# Patient Record
Sex: Female | Born: 1945 | Race: Black or African American | Hispanic: No | Smoking: Never smoker
Health system: Southern US, Community
[De-identification: ages and names within clinical notes are randomized; demographics above are authoritative.]

## PROBLEM LIST (undated history)

## (undated) DIAGNOSIS — E785 Hyperlipidemia, unspecified: Secondary | ICD-10-CM

## (undated) DIAGNOSIS — G500 Trigeminal neuralgia: Secondary | ICD-10-CM

## (undated) DIAGNOSIS — I10 Essential (primary) hypertension: Secondary | ICD-10-CM

## (undated) DIAGNOSIS — I2510 Atherosclerotic heart disease of native coronary artery without angina pectoris: Secondary | ICD-10-CM

## (undated) DIAGNOSIS — I4891 Unspecified atrial fibrillation: Secondary | ICD-10-CM

## (undated) HISTORY — DX: Trigeminal neuralgia: G50.0

## (undated) HISTORY — PX: CHOLECYSTECTOMY: SHX55

## (undated) HISTORY — PX: ABDOMINAL HYSTERECTOMY: SHX81

---

## 1976-10-04 HISTORY — PX: OTHER SURGICAL HISTORY: SHX169

## 2001-04-10 ENCOUNTER — Emergency Department (HOSPITAL_COMMUNITY): Admission: EM | Admit: 2001-04-10 | Discharge: 2001-04-10 | Payer: Self-pay | Admitting: Emergency Medicine

## 2001-04-10 ENCOUNTER — Encounter: Payer: Self-pay | Admitting: Emergency Medicine

## 2004-07-30 ENCOUNTER — Ambulatory Visit (HOSPITAL_COMMUNITY): Admission: RE | Admit: 2004-07-30 | Discharge: 2004-07-30 | Payer: Self-pay | Admitting: Family Medicine

## 2004-08-12 ENCOUNTER — Ambulatory Visit (HOSPITAL_COMMUNITY): Admission: RE | Admit: 2004-08-12 | Discharge: 2004-08-12 | Payer: Self-pay | Admitting: Family Medicine

## 2004-10-14 ENCOUNTER — Ambulatory Visit (HOSPITAL_COMMUNITY): Admission: RE | Admit: 2004-10-14 | Discharge: 2004-10-14 | Payer: Self-pay | Admitting: Family Medicine

## 2005-01-08 ENCOUNTER — Ambulatory Visit (HOSPITAL_COMMUNITY): Admission: RE | Admit: 2005-01-08 | Discharge: 2005-01-08 | Payer: Self-pay | Admitting: Family Medicine

## 2005-05-19 ENCOUNTER — Ambulatory Visit (HOSPITAL_COMMUNITY): Admission: RE | Admit: 2005-05-19 | Discharge: 2005-05-19 | Payer: Self-pay | Admitting: Family Medicine

## 2006-11-22 ENCOUNTER — Ambulatory Visit (HOSPITAL_COMMUNITY): Admission: RE | Admit: 2006-11-22 | Discharge: 2006-11-22 | Payer: Self-pay | Admitting: Family Medicine

## 2006-12-07 ENCOUNTER — Encounter: Admission: RE | Admit: 2006-12-07 | Discharge: 2006-12-07 | Payer: Self-pay | Admitting: Family Medicine

## 2010-10-24 ENCOUNTER — Encounter: Payer: Self-pay | Admitting: Family Medicine

## 2010-10-25 ENCOUNTER — Encounter: Payer: Self-pay | Admitting: Family Medicine

## 2010-12-22 ENCOUNTER — Ambulatory Visit (HOSPITAL_COMMUNITY)
Admission: RE | Admit: 2010-12-22 | Discharge: 2010-12-22 | Disposition: A | Discharge status: Home / Self Care | Payer: Self-pay | Source: Ambulatory Visit | Attending: Family Medicine | Admitting: Family Medicine

## 2010-12-22 ENCOUNTER — Other Ambulatory Visit (HOSPITAL_COMMUNITY): Payer: Self-pay | Admitting: Family Medicine

## 2010-12-22 DIAGNOSIS — M545 Low back pain, unspecified: Secondary | ICD-10-CM | POA: Insufficient documentation

## 2010-12-22 DIAGNOSIS — N390 Urinary tract infection, site not specified: Secondary | ICD-10-CM

## 2010-12-22 DIAGNOSIS — R1031 Right lower quadrant pain: Secondary | ICD-10-CM | POA: Insufficient documentation

## 2014-08-13 ENCOUNTER — Other Ambulatory Visit (HOSPITAL_COMMUNITY): Payer: Self-pay | Admitting: Family Medicine

## 2014-08-13 DIAGNOSIS — M859 Disorder of bone density and structure, unspecified: Secondary | ICD-10-CM

## 2014-08-13 DIAGNOSIS — M8580 Other specified disorders of bone density and structure, unspecified site: Secondary | ICD-10-CM

## 2014-08-13 DIAGNOSIS — Z1231 Encounter for screening mammogram for malignant neoplasm of breast: Secondary | ICD-10-CM

## 2014-08-16 ENCOUNTER — Other Ambulatory Visit (HOSPITAL_COMMUNITY): Payer: Self-pay

## 2014-08-16 ENCOUNTER — Ambulatory Visit (HOSPITAL_COMMUNITY): Payer: Self-pay

## 2014-09-02 ENCOUNTER — Ambulatory Visit (HOSPITAL_COMMUNITY)
Admission: RE | Admit: 2014-09-02 | Discharge: 2014-09-02 | Disposition: A | Discharge status: Home / Self Care | Payer: Medicare Other | Source: Ambulatory Visit | Attending: Family Medicine | Admitting: Family Medicine

## 2014-09-02 DIAGNOSIS — Z1231 Encounter for screening mammogram for malignant neoplasm of breast: Secondary | ICD-10-CM | POA: Diagnosis present

## 2014-09-02 DIAGNOSIS — M859 Disorder of bone density and structure, unspecified: Secondary | ICD-10-CM

## 2014-09-02 DIAGNOSIS — M8580 Other specified disorders of bone density and structure, unspecified site: Secondary | ICD-10-CM | POA: Insufficient documentation

## 2014-09-06 ENCOUNTER — Other Ambulatory Visit: Payer: Self-pay | Admitting: Family Medicine

## 2014-09-06 DIAGNOSIS — R928 Other abnormal and inconclusive findings on diagnostic imaging of breast: Secondary | ICD-10-CM

## 2014-09-17 ENCOUNTER — Ambulatory Visit (HOSPITAL_COMMUNITY)
Admission: RE | Admit: 2014-09-17 | Discharge: 2014-09-17 | Disposition: A | Discharge status: Home / Self Care | Payer: Medicare Other | Source: Ambulatory Visit | Attending: Family Medicine | Admitting: Family Medicine

## 2014-09-17 DIAGNOSIS — R928 Other abnormal and inconclusive findings on diagnostic imaging of breast: Secondary | ICD-10-CM

## 2014-09-17 DIAGNOSIS — R922 Inconclusive mammogram: Secondary | ICD-10-CM | POA: Insufficient documentation

## 2014-09-17 DIAGNOSIS — Z1231 Encounter for screening mammogram for malignant neoplasm of breast: Secondary | ICD-10-CM | POA: Diagnosis not present

## 2015-08-26 ENCOUNTER — Other Ambulatory Visit (HOSPITAL_COMMUNITY): Payer: Self-pay | Admitting: Family Medicine

## 2015-08-26 DIAGNOSIS — Z1231 Encounter for screening mammogram for malignant neoplasm of breast: Secondary | ICD-10-CM

## 2015-09-22 ENCOUNTER — Ambulatory Visit (HOSPITAL_COMMUNITY)
Admission: RE | Admit: 2015-09-22 | Discharge: 2015-09-22 | Disposition: A | Discharge status: Home / Self Care | Payer: Medicare Other | Source: Ambulatory Visit | Attending: Family Medicine | Admitting: Family Medicine

## 2015-09-22 DIAGNOSIS — Z1231 Encounter for screening mammogram for malignant neoplasm of breast: Secondary | ICD-10-CM

## 2015-09-24 ENCOUNTER — Other Ambulatory Visit: Payer: Self-pay | Admitting: Family Medicine

## 2015-09-24 DIAGNOSIS — R928 Other abnormal and inconclusive findings on diagnostic imaging of breast: Secondary | ICD-10-CM

## 2015-10-09 ENCOUNTER — Other Ambulatory Visit: Payer: Self-pay | Admitting: Family Medicine

## 2015-10-09 DIAGNOSIS — R928 Other abnormal and inconclusive findings on diagnostic imaging of breast: Secondary | ICD-10-CM

## 2015-10-15 ENCOUNTER — Ambulatory Visit
Admission: RE | Admit: 2015-10-15 | Discharge: 2015-10-15 | Disposition: A | Discharge status: Home / Self Care | Payer: Medicare Other | Source: Ambulatory Visit | Attending: Family Medicine | Admitting: Family Medicine

## 2015-10-15 DIAGNOSIS — R928 Other abnormal and inconclusive findings on diagnostic imaging of breast: Secondary | ICD-10-CM

## 2016-05-14 ENCOUNTER — Inpatient Hospital Stay (HOSPITAL_COMMUNITY): Payer: Medicare Other

## 2016-05-14 ENCOUNTER — Encounter (HOSPITAL_COMMUNITY)
Admission: EM | Disposition: A | Discharge status: Home / Self Care | Payer: Self-pay | Source: Home / Self Care | Attending: Cardiology

## 2016-05-14 ENCOUNTER — Inpatient Hospital Stay (HOSPITAL_COMMUNITY)
Admission: EM | Admit: 2016-05-14 | Discharge: 2016-05-15 | DRG: 247 | Disposition: A | Discharge status: Home / Self Care | Payer: Medicare Other | Attending: Cardiology | Admitting: Cardiology

## 2016-05-14 ENCOUNTER — Encounter (HOSPITAL_COMMUNITY): Payer: Self-pay

## 2016-05-14 ENCOUNTER — Emergency Department (HOSPITAL_COMMUNITY): Payer: Medicare Other

## 2016-05-14 DIAGNOSIS — Z79899 Other long term (current) drug therapy: Secondary | ICD-10-CM

## 2016-05-14 DIAGNOSIS — I4891 Unspecified atrial fibrillation: Secondary | ICD-10-CM | POA: Diagnosis present

## 2016-05-14 DIAGNOSIS — E785 Hyperlipidemia, unspecified: Secondary | ICD-10-CM

## 2016-05-14 DIAGNOSIS — I2510 Atherosclerotic heart disease of native coronary artery without angina pectoris: Secondary | ICD-10-CM | POA: Diagnosis present

## 2016-05-14 DIAGNOSIS — E876 Hypokalemia: Secondary | ICD-10-CM | POA: Diagnosis present

## 2016-05-14 DIAGNOSIS — T461X5A Adverse effect of calcium-channel blockers, initial encounter: Secondary | ICD-10-CM | POA: Diagnosis not present

## 2016-05-14 DIAGNOSIS — R9431 Abnormal electrocardiogram [ECG] [EKG]: Secondary | ICD-10-CM

## 2016-05-14 DIAGNOSIS — R001 Bradycardia, unspecified: Secondary | ICD-10-CM | POA: Diagnosis not present

## 2016-05-14 DIAGNOSIS — R079 Chest pain, unspecified: Secondary | ICD-10-CM | POA: Diagnosis present

## 2016-05-14 DIAGNOSIS — I214 Non-ST elevation (NSTEMI) myocardial infarction: Secondary | ICD-10-CM | POA: Diagnosis present

## 2016-05-14 DIAGNOSIS — T502X5A Adverse effect of carbonic-anhydrase inhibitors, benzothiadiazides and other diuretics, initial encounter: Secondary | ICD-10-CM | POA: Diagnosis not present

## 2016-05-14 DIAGNOSIS — I10 Essential (primary) hypertension: Secondary | ICD-10-CM | POA: Diagnosis present

## 2016-05-14 HISTORY — DX: Unspecified atrial fibrillation: I48.91

## 2016-05-14 HISTORY — PX: CARDIAC CATHETERIZATION: SHX172

## 2016-05-14 HISTORY — DX: Essential (primary) hypertension: I10

## 2016-05-14 HISTORY — DX: Hyperlipidemia, unspecified: E78.5

## 2016-05-14 HISTORY — DX: Atherosclerotic heart disease of native coronary artery without angina pectoris: I25.10

## 2016-05-14 LAB — CBC
HCT: 35.5 % — ABNORMAL LOW (ref 36.0–46.0)
Hemoglobin: 11.3 g/dL — ABNORMAL LOW (ref 12.0–15.0)
MCH: 29.4 pg (ref 26.0–34.0)
MCHC: 31.8 g/dL (ref 30.0–36.0)
MCV: 92.4 fL (ref 78.0–100.0)
Platelets: 301 10*3/uL (ref 150–400)
RBC: 3.84 MIL/uL — ABNORMAL LOW (ref 3.87–5.11)
RDW: 12.4 % (ref 11.5–15.5)
WBC: 6.3 10*3/uL (ref 4.0–10.5)

## 2016-05-14 LAB — BASIC METABOLIC PANEL
Anion gap: 6 (ref 5–15)
Anion gap: 8 (ref 5–15)
BUN: 5 mg/dL — ABNORMAL LOW (ref 6–20)
BUN: 8 mg/dL (ref 6–20)
CO2: 25 mmol/L (ref 22–32)
CO2: 27 mmol/L (ref 22–32)
CREATININE: 0.87 mg/dL (ref 0.44–1.00)
Calcium: 9 mg/dL (ref 8.9–10.3)
Calcium: 9.1 mg/dL (ref 8.9–10.3)
Chloride: 106 mmol/L (ref 101–111)
Chloride: 108 mmol/L (ref 101–111)
Creatinine, Ser: 0.72 mg/dL (ref 0.44–1.00)
GFR calc Af Amer: >60 mL/min (ref >60)
GFR calc non Af Amer: >60 mL/min (ref >60)
Glucose, Bld: 89 mg/dL (ref 65–99)
Glucose, Bld: 97 mg/dL (ref 65–99)
POTASSIUM: 3.1 mmol/L — AB (ref 3.5–5.1)
Potassium: 3.7 mmol/L (ref 3.5–5.1)
SODIUM: 139 mmol/L (ref 135–145)
Sodium: 141 mmol/L (ref 135–145)

## 2016-05-14 LAB — CBC WITH DIFFERENTIAL/PLATELET
BASOS ABS: 0.1 10*3/uL (ref 0.0–0.1)
Basophils Relative: 1 %
EOS ABS: 0.3 10*3/uL (ref 0.0–0.7)
EOS PCT: 4 %
HCT: 37.1 % (ref 36.0–46.0)
Hemoglobin: 12.4 g/dL (ref 12.0–15.0)
Lymphocytes Relative: 38 %
Lymphs Abs: 2.3 10*3/uL (ref 0.7–4.0)
MCH: 30.5 pg (ref 26.0–34.0)
MCHC: 33.4 g/dL (ref 30.0–36.0)
MCV: 91.2 fL (ref 78.0–100.0)
Monocytes Absolute: 0.5 10*3/uL (ref 0.1–1.0)
Monocytes Relative: 9 %
Neutro Abs: 2.9 10*3/uL (ref 1.7–7.7)
Neutrophils Relative %: 48 %
PLATELETS: 341 10*3/uL (ref 150–400)
RBC: 4.07 MIL/uL (ref 3.87–5.11)
RDW: 12.4 % (ref 11.5–15.5)
WBC: 6 10*3/uL (ref 4.0–10.5)

## 2016-05-14 LAB — APTT: aPTT: 33 seconds (ref 24–36)

## 2016-05-14 LAB — PROTIME-INR
INR: 1
INR: 1.07
PROTHROMBIN TIME: 13.2 s (ref 11.4–15.2)
Prothrombin Time: 13.9 seconds (ref 11.4–15.2)

## 2016-05-14 LAB — TROPONIN I
TROPONIN I: 0.04 ng/mL — AB (ref <0.03)
Troponin I: 0.24 ng/mL (ref <0.03)
Troponin I: 0.57 ng/mL (ref <0.03)
Troponin I: 0.58 ng/mL (ref <0.03)

## 2016-05-14 LAB — POCT ACTIVATED CLOTTING TIME
Activated Clotting Time: 263 seconds
Activated Clotting Time: 527 seconds

## 2016-05-14 LAB — ECHOCARDIOGRAM COMPLETE
HEIGHTINCHES: 68 in
Weight: 2640 oz

## 2016-05-14 LAB — HEPARIN LEVEL (UNFRACTIONATED): Heparin Unfractionated: 0.89 IU/mL — ABNORMAL HIGH (ref 0.30–0.70)

## 2016-05-14 SURGERY — LEFT HEART CATH AND CORONARY ANGIOGRAPHY
Anesthesia: LOCAL

## 2016-05-14 MED ORDER — HEPARIN SODIUM (PORCINE) 1000 UNIT/ML IJ SOLN
INTRAMUSCULAR | Status: DC | PRN
  Administered 2016-05-14: 3500 [IU] via INTRAVENOUS
  Administered 2016-05-14: 2000 [IU] via INTRAVENOUS
  Administered 2016-05-14: 6500 [IU] via INTRAVENOUS

## 2016-05-14 MED ORDER — ASPIRIN 81 MG PO CHEW
81.0000 mg | CHEWABLE_TABLET | ORAL | Status: AC
  Administered 2016-05-14: 81 mg via ORAL
  Filled 2016-05-14: qty 1

## 2016-05-14 MED ORDER — ACETAMINOPHEN 325 MG PO TABS
650.0000 mg | ORAL_TABLET | ORAL | Status: DC | PRN
  Administered 2016-05-14: 650 mg via ORAL
  Filled 2016-05-14: qty 2

## 2016-05-14 MED ORDER — TICAGRELOR 90 MG PO TABS
ORAL_TABLET | ORAL | Status: AC
  Filled 2016-05-14: qty 1

## 2016-05-14 MED ORDER — MIDAZOLAM HCL 2 MG/2ML IJ SOLN
INTRAMUSCULAR | Status: AC
  Filled 2016-05-14: qty 2

## 2016-05-14 MED ORDER — ASPIRIN EC 81 MG PO TBEC
81.0000 mg | DELAYED_RELEASE_TABLET | Freq: Every day | ORAL | Status: DC
Start: 2016-05-15 — End: 2016-05-15
  Administered 2016-05-15: 81 mg via ORAL
  Filled 2016-05-14: qty 1

## 2016-05-14 MED ORDER — POTASSIUM CHLORIDE CRYS ER 20 MEQ PO TBCR
20.0000 meq | EXTENDED_RELEASE_TABLET | Freq: Every day | ORAL | Status: DC
  Administered 2016-05-14 – 2016-05-15 (×2): 20 meq via ORAL
  Filled 2016-05-14: qty 1

## 2016-05-14 MED ORDER — NITROGLYCERIN 1 MG/10 ML FOR IR/CATH LAB
INTRA_ARTERIAL | Status: AC
  Filled 2016-05-14: qty 10

## 2016-05-14 MED ORDER — NITROGLYCERIN IN D5W 200-5 MCG/ML-% IV SOLN
10.0000 ug/min | INTRAVENOUS | Status: DC
  Administered 2016-05-14: 10 ug/min via INTRAVENOUS
  Filled 2016-05-14: qty 250

## 2016-05-14 MED ORDER — HEPARIN BOLUS VIA INFUSION
4000.0000 [IU] | Freq: Once | INTRAVENOUS | Status: AC
  Administered 2016-05-14: 4000 [IU] via INTRAVENOUS

## 2016-05-14 MED ORDER — TICAGRELOR 90 MG PO TABS
90.0000 mg | ORAL_TABLET | Freq: Two times a day (BID) | ORAL | Status: DC
  Administered 2016-05-14 – 2016-05-15 (×2): 90 mg via ORAL
  Filled 2016-05-14 (×2): qty 1

## 2016-05-14 MED ORDER — SODIUM CHLORIDE 0.9% FLUSH
3.0000 mL | Freq: Two times a day (BID) | INTRAVENOUS | Status: DC
  Administered 2016-05-14: 3 mL via INTRAVENOUS

## 2016-05-14 MED ORDER — NITROGLYCERIN 0.4 MG SL SUBL: 0.4000 mg | SUBLINGUAL_TABLET | SUBLINGUAL | Status: DC | PRN

## 2016-05-14 MED ORDER — VERAPAMIL HCL 2.5 MG/ML IV SOLN
INTRAVENOUS | Status: AC
  Filled 2016-05-14: qty 2

## 2016-05-14 MED ORDER — DILTIAZEM HCL 60 MG PO TABS: 120.0000 mg | ORAL_TABLET | Freq: Two times a day (BID) | ORAL | Status: DC

## 2016-05-14 MED ORDER — HEPARIN SODIUM (PORCINE) 1000 UNIT/ML IJ SOLN
INTRAMUSCULAR | Status: AC
  Filled 2016-05-14: qty 1

## 2016-05-14 MED ORDER — SODIUM CHLORIDE 0.9% FLUSH
3.0000 mL | INTRAVENOUS | Status: DC | PRN
  Administered 2016-05-14: 3 mL via INTRAVENOUS
  Filled 2016-05-14: qty 3

## 2016-05-14 MED ORDER — SODIUM CHLORIDE 0.9 % WEIGHT BASED INFUSION
1.0000 mL/kg/h | INTRAVENOUS | Status: DC
  Administered 2016-05-14: 1 mL/kg/h via INTRAVENOUS

## 2016-05-14 MED ORDER — ONDANSETRON HCL 4 MG/2ML IJ SOLN
4.0000 mg | Freq: Four times a day (QID) | INTRAMUSCULAR | Status: DC | PRN
  Administered 2016-05-14: 4 mg via INTRAVENOUS
  Filled 2016-05-14: qty 2

## 2016-05-14 MED ORDER — NITROGLYCERIN IN D5W 200-5 MCG/ML-% IV SOLN: 10.0000 ug/min | INTRAVENOUS | Status: DC

## 2016-05-14 MED ORDER — HEPARIN (PORCINE) IN NACL 2-0.9 UNIT/ML-% IJ SOLN
INTRAMUSCULAR | Status: DC | PRN
  Administered 2016-05-14: 10 mL via INTRA_ARTERIAL

## 2016-05-14 MED ORDER — FENTANYL CITRATE (PF) 100 MCG/2ML IJ SOLN
INTRAMUSCULAR | Status: DC | PRN
  Administered 2016-05-14: 25 ug via INTRAVENOUS

## 2016-05-14 MED ORDER — DILTIAZEM HCL 60 MG PO TABS
120.0000 mg | ORAL_TABLET | Freq: Four times a day (QID) | ORAL | Status: DC
  Administered 2016-05-14: 120 mg via ORAL
  Filled 2016-05-14: qty 2

## 2016-05-14 MED ORDER — HEART ATTACK BOUNCING BOOK
Freq: Once | Status: AC
  Administered 2016-05-14: 17:00:00
  Filled 2016-05-14: qty 1

## 2016-05-14 MED ORDER — MIDAZOLAM HCL 2 MG/2ML IJ SOLN
INTRAMUSCULAR | Status: DC | PRN
  Administered 2016-05-14: 2 mg via INTRAVENOUS

## 2016-05-14 MED ORDER — NITROGLYCERIN 0.4 MG SL SUBL
0.4000 mg | SUBLINGUAL_TABLET | SUBLINGUAL | Status: DC | PRN
  Administered 2016-05-14 (×2): 0.4 mg via SUBLINGUAL
  Filled 2016-05-14: qty 1

## 2016-05-14 MED ORDER — HEPARIN (PORCINE) IN NACL 2-0.9 UNIT/ML-% IJ SOLN
INTRAMUSCULAR | Status: AC
  Filled 2016-05-14: qty 1000

## 2016-05-14 MED ORDER — AMLODIPINE BESYLATE 5 MG PO TABS
5.0000 mg | ORAL_TABLET | Freq: Every day | ORAL | Status: DC
  Administered 2016-05-14: 5 mg via ORAL
  Filled 2016-05-14: qty 1

## 2016-05-14 MED ORDER — IOPAMIDOL (ISOVUE-370) INJECTION 76%
INTRAVENOUS | Status: AC
Start: 2016-05-14 — End: 2016-05-14
  Filled 2016-05-14: qty 100

## 2016-05-14 MED ORDER — SODIUM CHLORIDE 0.9 % IV SOLN: 250.0000 mL | INTRAVENOUS | Status: DC | PRN

## 2016-05-14 MED ORDER — HEPARIN (PORCINE) IN NACL 100-0.45 UNIT/ML-% IJ SOLN
850.0000 [IU]/h | INTRAMUSCULAR | Status: DC
  Administered 2016-05-14: 1000 [IU]/h via INTRAVENOUS
  Filled 2016-05-14: qty 250

## 2016-05-14 MED ORDER — NITROGLYCERIN 1 MG/10 ML FOR IR/CATH LAB
INTRA_ARTERIAL | Status: DC | PRN
Start: 2016-05-14 — End: 2016-05-14
  Administered 2016-05-14: 200 ug via INTRACORONARY

## 2016-05-14 MED ORDER — ANGIOPLASTY BOOK
Freq: Once | Status: AC
  Administered 2016-05-14: 17:00:00
  Filled 2016-05-14: qty 1

## 2016-05-14 MED ORDER — DILTIAZEM HCL ER 60 MG PO CP12: 120.0000 mg | ORAL_CAPSULE | Freq: Two times a day (BID) | ORAL | Status: DC

## 2016-05-14 MED ORDER — LIDOCAINE HCL (PF) 1 % IJ SOLN
INTRAMUSCULAR | Status: AC
  Filled 2016-05-14: qty 30

## 2016-05-14 MED ORDER — SODIUM CHLORIDE 0.9 % WEIGHT BASED INFUSION
3.0000 mL/kg/h | INTRAVENOUS | Status: DC
  Administered 2016-05-14: 3 mL/kg/h via INTRAVENOUS

## 2016-05-14 MED ORDER — ASPIRIN 81 MG PO CHEW
324.0000 mg | CHEWABLE_TABLET | Freq: Once | ORAL | Status: AC
  Administered 2016-05-14: 324 mg via ORAL
  Filled 2016-05-14: qty 4

## 2016-05-14 MED ORDER — POTASSIUM CHLORIDE CRYS ER 20 MEQ PO TBCR
40.0000 meq | EXTENDED_RELEASE_TABLET | Freq: Once | ORAL | Status: AC
  Administered 2016-05-14: 40 meq via ORAL
  Filled 2016-05-14: qty 2

## 2016-05-14 MED ORDER — IOPAMIDOL (ISOVUE-370) INJECTION 76%
INTRAVENOUS | Status: DC | PRN
  Administered 2016-05-14: 120 mL via INTRAVENOUS

## 2016-05-14 MED ORDER — HYDROCHLOROTHIAZIDE 25 MG PO TABS
12.5000 mg | ORAL_TABLET | Freq: Every day | ORAL | Status: DC
  Administered 2016-05-14 – 2016-05-15 (×2): 12.5 mg via ORAL
  Filled 2016-05-14 (×2): qty 1

## 2016-05-14 MED ORDER — POTASSIUM CHLORIDE 10 MEQ/100ML IV SOLN
10.0000 meq | Freq: Once | INTRAVENOUS | Status: AC
  Administered 2016-05-14: 10 meq via INTRAVENOUS
  Filled 2016-05-14: qty 100

## 2016-05-14 MED ORDER — SODIUM CHLORIDE 0.9 % IV SOLN: INTRAVENOUS | Status: AC

## 2016-05-14 MED ORDER — METOPROLOL TARTRATE 25 MG PO TABS: 25.0000 mg | ORAL_TABLET | Freq: Two times a day (BID) | ORAL | Status: DC

## 2016-05-14 MED ORDER — IOPAMIDOL (ISOVUE-370) INJECTION 76%
INTRAVENOUS | Status: AC
  Filled 2016-05-14: qty 50

## 2016-05-14 MED ORDER — TICAGRELOR 90 MG PO TABS
ORAL_TABLET | ORAL | Status: DC | PRN
  Administered 2016-05-14: 180 mg via ORAL

## 2016-05-14 MED ORDER — HEPARIN SODIUM (PORCINE) 1000 UNIT/ML IJ SOLN
INTRAMUSCULAR | Status: AC
Start: 2016-05-14 — End: 2016-05-14
  Filled 2016-05-14: qty 1

## 2016-05-14 MED ORDER — LIDOCAINE HCL (PF) 1 % IJ SOLN
INTRAMUSCULAR | Status: DC | PRN
  Administered 2016-05-14: 3 mL via INTRADERMAL

## 2016-05-14 MED ORDER — HEPARIN (PORCINE) IN NACL 2-0.9 UNIT/ML-% IJ SOLN
INTRAMUSCULAR | Status: DC | PRN
  Administered 2016-05-14: 1000 mL via INTRA_ARTERIAL

## 2016-05-14 MED ORDER — FENTANYL CITRATE (PF) 100 MCG/2ML IJ SOLN
INTRAMUSCULAR | Status: AC
  Filled 2016-05-14: qty 2

## 2016-05-14 SURGICAL SUPPLY — 20 items
BALLN EMERGE MR 2.0X12 (BALLOONS) ×2
BALLN ~~LOC~~ EMERGE MR 2.5X12 (BALLOONS) ×2
BALLOON EMERGE MR 2.0X12 (BALLOONS) ×1 IMPLANT
BALLOON ~~LOC~~ EMERGE MR 2.5X12 (BALLOONS) ×1 IMPLANT
CATH INFINITI 5 FR JL3.5 (CATHETERS) ×2 IMPLANT
CATH INFINITI 5FR ANG PIGTAIL (CATHETERS) ×2 IMPLANT
CATH INFINITI JR4 5F (CATHETERS) ×2 IMPLANT
CATH VISTA GUIDE 6FR XBLAD3.0 (CATHETERS) ×2 IMPLANT
DEVICE RAD COMP TR BAND LRG (VASCULAR PRODUCTS) ×2 IMPLANT
GLIDESHEATH SLEND SS 6F .021 (SHEATH) ×2 IMPLANT
KIT ENCORE 26 ADVANTAGE (KITS) ×2 IMPLANT
KIT HEART LEFT (KITS) ×2 IMPLANT
PACK CARDIAC CATHETERIZATION (CUSTOM PROCEDURE TRAY) ×2 IMPLANT
STENT PROMUS PREM MR 2.25X16 (Permanent Stent) ×2 IMPLANT
SYR MEDRAD MARK V 150ML (SYRINGE) ×2 IMPLANT
TRANSDUCER W/STOPCOCK (MISCELLANEOUS) ×2 IMPLANT
TUBING CIL FLEX 10 FLL-RA (TUBING) ×2 IMPLANT
WIRE COUGAR XT STRL 190CM (WIRE) ×2 IMPLANT
WIRE HI TORQ VERSACORE-J 145CM (WIRE) ×2 IMPLANT
WIRE SAFE-T 1.5MM-J .035X260CM (WIRE) ×2 IMPLANT

## 2016-05-14 NOTE — Progress Notes (Signed)
Pt needs prior authorization for new med brilinta. Info is on front of chart in computer.  Cost is $74 for pt and pt states she cannot afford it.  30 day free card given. Pt would poss like to switch meds.  [place] PA notified. No new orders at this time.

## 2016-05-14 NOTE — Progress Notes (Signed)
  Echocardiogram 2D Echocardiogram has been performed.  Tricia Savage 05/14/2016, 3:35 PM

## 2016-05-14 NOTE — Progress Notes (Signed)
Right Radial TR Band d/c'd. Site sl bruised at insertion site. Palpable 2t  radial and ulnar pulses. Clean dry drsg applied. Pt tolerated well.

## 2016-05-14 NOTE — Progress Notes (Addendum)
Called by nursing that patient's BP starting to elevate. Was normotensive earlier. Dr. Camillia Herter cath note indicates plan to start BB but not ordered. I was planning to start but telemetry reviewed. HR presently 55-60, 2.1 sec pause earlier today, HR in the upper 40s around 1:30pm. Favor d/c diltiazem and starting amlodipine instead. Note she only got short-acting dilt per home regimen this AM around 630. If HR is fine the rest of the day we can probably start metoprolol either late tonight or in the AM tomorrow. Have written nurse order to contact us later this evening with an update. Dayna Dunn PA-C

## 2016-05-14 NOTE — Progress Notes (Signed)
Pt asleep. In no apparent distress VSS

## 2016-05-14 NOTE — Progress Notes (Signed)
Patient: Tricia Savage / Admit Date: 05/14/2016 / Date of Encounter: 05/14/2016, 9:16 AM   Subjective: No further CP. We discussed cath at length and all questions answered.   Objective: Telemetry: NSR Physical Exam: Blood pressure 133/79, pulse 61, temperature 98.3 F (36.8 C), temperature source Oral, resp. rate 18, height 5\' 8"  (1.727 m), weight 163 lb 3.2 oz (74 kg), SpO2 98 %. General: Well developed, well nourished AAF, in no acute distress. Head: Normocephalic, atraumatic, sclera non-icteric, no xanthomas, nares are without discharge. Neck: Negative for carotid bruits. JVP not elevated. Lungs: Clear bilaterally to auscultation without wheezes, rales, or rhonchi. Breathing is unlabored. Heart: RRR S1 S2 without murmurs, rubs, or gallops.  Abdomen: Soft, non-tender, non-distended with normoactive bowel sounds. No rebound/guarding. Extremities: No clubbing or cyanosis. No edema. Distal pedal pulses are 2+ and equal bilaterally. Neuro: Alert and oriented X 3. Moves all extremities spontaneously. Psych:  Responds to questions appropriately with a normal affect.   Intake/Output Summary (Last 24 hours) at 05/14/16 0916 Last data filed at 05/14/16 0721  Gross per 24 hour  Intake           119.52 ml  Output              950 ml  Net          -830.48 ml    Inpatient Medications:  . aspirin  81 mg Oral Pre-Cath  . [START ON 05/15/2016] aspirin EC  81 mg Oral Daily  . diltiazem  120 mg Oral Q6H  . hydrochlorothiazide  12.5 mg Oral Daily  . potassium chloride  20 mEq Oral Daily   Infusions:  . sodium chloride     Followed by  . sodium chloride    . heparin 1,000 Units/hr (05/14/16 0200)  . nitroGLYCERIN      Labs:  Recent Labs  05/14/16 0015 05/14/16 0745  NA 139 141  K 3.1* 3.7  CL 106 108  CO2 27 25  GLUCOSE 97 89  BUN 8 5*  CREATININE 0.87 0.72  CALCIUM 9.1 9.0   No results for input(s): AST, ALT, ALKPHOS, BILITOT, PROT, ALBUMIN in the last 72 hours.  Recent  Labs  05/14/16 0015 05/14/16 0745  WBC 6.0 6.3  NEUTROABS 2.9  --   HGB 12.4 11.3*  HCT 37.1 35.5*  MCV 91.2 92.4  PLT 341 301    Recent Labs  05/14/16 0015 05/14/16 0330  TROPONINI 0.04* 0.24*   Invalid input(s): POCBNP No results for input(s): HGBA1C in the last 72 hours.   Radiology/Studies:  Dg Chest Port 1 View  Result Date: 05/14/2016 CLINICAL DATA:  Acute chest pain for 12 hours. EXAM: PORTABLE CHEST 1 VIEW COMPARISON:  None. FINDINGS: The cardiomediastinal silhouette is unremarkable. There is no evidence of focal airspace disease, pulmonary edema, suspicious pulmonary nodule/mass, pleural effusion, or pneumothorax. No acute bony abnormalities are identified. IMPRESSION: No active disease. Electronically Signed   By: Margarette Canada M.D.   On: 05/14/2016 01:30     Assessment and Plan  53F with HTN, HLD (statin intolerant), remote afib 2000 presented with CP and elevated troponin. Workup notable for + troponin, mild anemia, elevated BP.  1. Chest pain/NSTEMI - plan cath today. Continue ASA, heparin. Intolerant of statin. Risks and benefits of cardiac catheterization have been discussed with the patient.  These include bleeding, infection, kidney damage, stroke, heart attack, death.  The patient understands these risks and is willing to proceed. If +CAD will need BB on board.  2. Accelerated HTN on arrival - f/u BPs improved after receiving SL on admission. She is on diltiazem 120mg  (takes TWICE A DAY not QID as med rec states) and HCTZ at home. Will tentatively change diltiazem to SR form but will need to use cath results to help guide med adjustment (i.e. Possible change to BB if CAD or LV dysfunction are present).  3. Hyperlipidemia - said to be statin intolerant. Can f/u lipid clinic after DC to consider PCSK9 if appropriate.  F/u Liver/lipids in AM.  4. Hypokalemia - suspect r/t HCTZ use. Repleted last night in ER, 3.7 today. Start KCl 13meq daily.  Signed, Melina Copa  PA-C Pager: [phone]   I have personally seen and examined this patient with Melina Copa, PA-C I agree with the assessment and plan as outlined above. She is admitted with unstable angina/NSTEMI. No chest pain on heparin and NTG. Plan cardiac cath later today with possible PCI. Pt agrees to proceed.   [name] 05/14/2016 9:55 AM

## 2016-05-14 NOTE — Progress Notes (Signed)
S/W JANE @ [employer] # [phone]   BRILINTA 90 MG BID ( 30 )   COVER- YES  CO-PAY - $ 74.00    60 TAB  TIER- 3 DRUG  PRIOR APPROVAL -YES # [phone] OPT- 5  PHARMACY : ANY RETAIL

## 2016-05-14 NOTE — Care Management Note (Signed)
Case Management Note  Patient Details  Name: Tricia Savage MRN: [id number] Date of Birth: 27-Aug-1946  Subjective/Objective:   Patient is s/p coroanry stent intervention, lives alone , pta indep. Will be on brilinta, co pay is 74.00 and will need prior [id number] opt 5, RN will give this info to MD, patient states 74.00 is too much for her.  NCM informed her that the MD may change her to something else at her follow up apt. Patient's pharmacy [facility] in [place] on [address] hs brilinta in stock.                 Action/Plan:   Expected Discharge Date:                  Expected Discharge Plan:  Home/Self Care  In-House Referral:     Discharge planning Services  CM Consult  Post Acute Care Choice:    Choice offered to:     DME Arranged:    DME Agency:     HH Arranged:    HH Agency:     Status of Service:  Completed, signed off  If discussed at [employer] of Stay Meetings, dates discussed:    Additional Comments:  Zenon Mayo, RN 05/14/2016, 5:45 PM

## 2016-05-14 NOTE — H&P (Signed)
History & Physical    Patient ID: Tricia Savage MRN: [id number], DOB/AGE: [birth year]   Admit date: 05/14/2016   Primary Physician: Robert Bellow, MD Primary Cardiologist: ---  Patient Profile    70 y o woman with new onset CP, found to have NSTEMI  Past Medical History    Past Medical History:  Diagnosis Date  . Atrial fibrillation ([place])   . Hypertension     Past Surgical History:  Procedure Laterality Date  . ABDOMINAL HYSTERECTOMY       Allergies  Allergies  Allergen Reactions  . Atorvastatin     History of Present Illness    Tricia Savage has a PMH of HTN, HLD (statin intolerant). She also has a remote history of Afib in 2000. She is not on blood thinners. She presents from OSH where she went to for new onset CP and arm pain. Pain started yesterday. She was driving, so at rest. She had an episode of emesis paired with intense pain in her left shoulder down her arm. She aslo experienced some mild CP. Her pain persisted when she was in the OSH ED. She was given nitro with improvement of her pain.   She denies pain with activity. She is pretty active on a daily basis but does not actively exercise. No SOB no PND or orthopnea. She reports that her BP runs usually on the higher side. She reports compliance with her home meds. No new recent stressors.  At the OSH ED she was found to have diffuse ST depressions on ECG and a trop I of 0.04. Started on Heparin drip and nitro drip.   On arrival to [facility] she is pain free.     Home Medications    Prior to Admission medications   Medication Sig Start Date End Date Taking? Authorizing Provider  alendronate-cholecalciferol (FOSAMAX PLUS D) 70-2800 MG-UNIT tablet Take 1 tablet by mouth every 7 (seven) days. Take with a full glass of water on an empty stomach.   Yes Historical Provider, MD  diltiazem (CARDIZEM) 120 MG tablet Take 120 mg by mouth 4 (four) times daily.   Yes Historical Provider, MD  hydrochlorothiazide (HYDRODIURIL)  12.5 MG tablet Take 12.5 mg by mouth daily.   Yes Historical Provider, MD    Family History    No family history on file.  Social History    Social History   Social History  . Marital status: Divorced    Spouse name: N/A  . Number of children: N/A  . Years of education: N/A   Occupational History  . Not on file.   Social History Main Topics  . Smoking status: Never Smoker  . Smokeless tobacco: Never Used  . Alcohol use No  . Drug use: Unknown  . Sexual activity: Not on file   Other Topics Concern  . Not on file   Social History Narrative  . No narrative on file     Review of Systems    General:  No chills, fever, night sweats or weight changes.  Cardiovascular:  No chest pain, dyspnea on exertion, edema, orthopnea, palpitations, paroxysmal nocturnal dyspnea. Dermatological: No rash, lesions/masses Respiratory: No cough, dyspnea Urologic: No hematuria, dysuria Abdominal:   No nausea, vomiting, diarrhea, bright red blood per rectum, melena, or hematemesis Neurologic:  No visual changes, wkns, changes in mental status. All other systems reviewed and are otherwise negative except as noted above.  Physical Exam    Blood pressure (!) 172/82, pulse 64, temperature 98.2  F (36.8 C), temperature source Oral, resp. rate 18, height 5\' 8"  (1.727 m), weight 74.8 kg (165 lb), SpO2 99 %.  General: Pleasant, NAD Psych: Normal affect. Neuro: Alert and oriented X 3. Moves all extremities spontaneously. HEENT: Normal  Neck: Supple without bruits or JVD. Lungs:  Resp regular and unlabored, CTA. Heart: RRR no s3, s4, or murmurs. Abdomen: Soft, non-tender, non-distended, BS + x 4.  Extremities: No clubbing, cyanosis or edema. DP/PT/Radials 2+ and equal bilaterally.  Labs    Troponin (Point of Care Test) No results for input(s): TROPIPOC in the last 72 hours.  Recent Labs  05/14/16 0015  TROPONINI 0.04*   Lab Results  Component Value Date   WBC 6.0 05/14/2016   HGB  12.4 05/14/2016   HCT 37.1 05/14/2016   MCV 91.2 05/14/2016   PLT 341 05/14/2016    Recent Labs Lab 05/14/16 0015  NA 139  K 3.1*  CL 106  CO2 27  BUN 8  CREATININE 0.87  CALCIUM 9.1  GLUCOSE 97   No results found for: CHOL, HDL, LDLCALC, TRIG No results found for: [facility]   Radiology Studies    Dg Chest Port 1 View  Result Date: 05/14/2016 CLINICAL DATA:  Acute chest pain for 12 hours. EXAM: PORTABLE CHEST 1 VIEW COMPARISON:  None. FINDINGS: The cardiomediastinal silhouette is unremarkable. There is no evidence of focal airspace disease, pulmonary edema, suspicious pulmonary nodule/mass, pleural effusion, or pneumothorax. No acute bony abnormalities are identified. IMPRESSION: No active disease. Electronically Signed   By: Margarette Canada M.D.   On: 05/14/2016 01:30    ECG & Cardiac Imaging    Repeat ECG at [facility] with NSR, 1st degree Av block without ST changes. INferior T wave flattening noted. No q waves but with R waves in anterior leads.   Assessment & Plan    Tricia Savage has a PMH of HTN, HLD and Afib? Now with NSTEMI. Now asymptomatic but had ischemic changes on ECG with CP and positive trop at OSH ED.   Plan: - Hep drip and Nitro drip - NPO for LHC in am - ASA 325 given at OSH. 81mg  in am. Holding of statin given her intolerance to simva and atorva before.  - She is on a calcium channel inhibitor. Would switch to BB if confirmed to have CAD.  - TTE ordered    Signed, Cristina Gong, MD 05/14/2016, 3:27 AM

## 2016-05-14 NOTE — H&P (View-Only) (Signed)
Patient: Tricia Savage / Admit Date: 05/14/2016 / Date of Encounter: 05/14/2016, 9:16 AM   Subjective: No further CP. We discussed cath at length and all questions answered.   Objective: Telemetry: NSR Physical Exam: Blood pressure 133/79, pulse 61, temperature 98.3 F (36.8 C), temperature source Oral, resp. rate 18, height 5\' 8"  (1.727 m), weight 163 lb 3.2 oz (74 kg), SpO2 98 %. General: Well developed, well nourished AAF, in no acute distress. Head: Normocephalic, atraumatic, sclera non-icteric, no xanthomas, nares are without discharge. Neck: Negative for carotid bruits. JVP not elevated. Lungs: Clear bilaterally to auscultation without wheezes, rales, or rhonchi. Breathing is unlabored. Heart: RRR S1 S2 without murmurs, rubs, or gallops.  Abdomen: Soft, non-tender, non-distended with normoactive bowel sounds. No rebound/guarding. Extremities: No clubbing or cyanosis. No edema. Distal pedal pulses are 2+ and equal bilaterally. Neuro: Alert and oriented X 3. Moves all extremities spontaneously. Psych:  Responds to questions appropriately with a normal affect.   Intake/Output Summary (Last 24 hours) at 05/14/16 0916 Last data filed at 05/14/16 0721  Gross per 24 hour  Intake           119.52 ml  Output              950 ml  Net          -830.48 ml    Inpatient Medications:  . aspirin  81 mg Oral Pre-Cath  . [START ON 05/15/2016] aspirin EC  81 mg Oral Daily  . diltiazem  120 mg Oral Q6H  . hydrochlorothiazide  12.5 mg Oral Daily  . potassium chloride  20 mEq Oral Daily   Infusions:  . sodium chloride     Followed by  . sodium chloride    . heparin 1,000 Units/hr (05/14/16 0200)  . nitroGLYCERIN      Labs:  Recent Labs  05/14/16 0015 05/14/16 0745  NA 139 141  K 3.1* 3.7  CL 106 108  CO2 27 25  GLUCOSE 97 89  BUN 8 5*  CREATININE 0.87 0.72  CALCIUM 9.1 9.0   No results for input(s): AST, ALT, ALKPHOS, BILITOT, PROT, ALBUMIN in the last 72 hours.  Recent  Labs  05/14/16 0015 05/14/16 0745  WBC 6.0 6.3  NEUTROABS 2.9  --   HGB 12.4 11.3*  HCT 37.1 35.5*  MCV 91.2 92.4  PLT 341 301    Recent Labs  05/14/16 0015 05/14/16 0330  TROPONINI 0.04* 0.24*   Invalid input(s): POCBNP No results for input(s): HGBA1C in the last 72 hours.   Radiology/Studies:  Dg Chest Port 1 View  Result Date: 05/14/2016 CLINICAL DATA:  Acute chest pain for 12 hours. EXAM: PORTABLE CHEST 1 VIEW COMPARISON:  None. FINDINGS: The cardiomediastinal silhouette is unremarkable. There is no evidence of focal airspace disease, pulmonary edema, suspicious pulmonary nodule/mass, pleural effusion, or pneumothorax. No acute bony abnormalities are identified. IMPRESSION: No active disease. Electronically Signed   By: Margarette Canada M.D.   On: 05/14/2016 01:30     Assessment and Plan  53F with HTN, HLD (statin intolerant), remote afib 2000 presented with CP and elevated troponin. Workup notable for + troponin, mild anemia, elevated BP.  1. Chest pain/NSTEMI - plan cath today. Continue ASA, heparin. Intolerant of statin. Risks and benefits of cardiac catheterization have been discussed with the patient.  These include bleeding, infection, kidney damage, stroke, heart attack, death.  The patient understands these risks and is willing to proceed. If +CAD will need BB on board.  2. Accelerated HTN on arrival - f/u BPs improved after receiving SL on admission. She is on diltiazem 120mg  (takes TWICE A DAY not QID as med rec states) and HCTZ at home. Will tentatively change diltiazem to SR form but will need to use cath results to help guide med adjustment (i.e. Possible change to BB if CAD or LV dysfunction are present).  3. Hyperlipidemia - said to be statin intolerant. Can f/u lipid clinic after DC to consider PCSK9 if appropriate.  F/u Liver/lipids in AM.  4. Hypokalemia - suspect r/t HCTZ use. Repleted last night in ER, 3.7 today. Start KCl 29meq daily.  Signed, Melina Copa  PA-C Pager: [phone]   I have personally seen and examined this patient with Melina Copa, PA-C I agree with the assessment and plan as outlined above. She is admitted with unstable angina/NSTEMI. No chest pain on heparin and NTG. Plan cardiac cath later today with possible PCI. Pt agrees to proceed.   [name] 05/14/2016 9:55 AM

## 2016-05-14 NOTE — Interval H&P Note (Signed)
History and Physical Interval Note:  05/14/2016 11:09 AM  Tricia Savage  has presented today for cardiac cath with the diagnosis of NSTEMI  The various methods of treatment have been discussed with the patient and family. After consideration of risks, benefits and other options for treatment, the patient has consented to  Procedure(s): Left Heart Cath and Coronary Angiography (N/A) as a surgical intervention .  The patient's history has been reviewed, patient examined, no change in status, stable for surgery.  I have reviewed the patient's chart and labs.  Questions were answered to the patient's satisfaction.    Cath Lab Visit (complete for each Cath Lab visit)  Clinical Evaluation Leading to the Procedure:   ACS: Yes.    Non-ACS:    Anginal Classification: CCS III  Anti-ischemic medical therapy: Minimal Therapy (1 class of medications)  Non-Invasive Test Results: No non-invasive testing performed  Prior CABG: No previous CABG         Tricia Savage

## 2016-05-14 NOTE — ED Provider Notes (Signed)
[facility] DEPT Provider Note   CSN: [id number] Arrival date & time: 05/14/16  0002  First Provider Contact:  First MD Initiated Contact with Patient 05/14/16 0010     By signing my name below, I, Tricia Savage, attest that this documentation has been prepared under the direction and in the presence of Delora Fuel, MD. Electronically Signed: Shanna Savage, ED Scribe. 05/14/16. 12:20 AM.    History   Chief Complaint Chief Complaint  Patient presents with  . Chest Pain  . Back Pain     The history is provided by the patient. No language interpreter was used.   HPI Comments:  Tricia Savage is a 70 y.o. female with a hx of HLD and HTN, who presents to the Emergency Department complaining of intermittent pain in back of both arms and chest, which started earlier today. Pain is characterized as dull. Associated symptoms include nausea followed by emesis of clear water earlier this morning, tenderness of chest to palpation and sharp pain near shoulder blade. Rates pain a 5 out of 10. Denies exacerbation with deep breaths, nausea and diaphoresis. Family history of heart problems.   No past medical history on file.  There are no active problems to display for this patient.   No past surgical history on file.  OB History    No data available       Home Medications    Prior to Admission medications   Not on File    Family History No family history on file.  Social History Social History  Substance Use Topics  . Smoking status: Not on file  . Smokeless tobacco: Not on file  . Alcohol use Not on file     Allergies   Review of patient's allergies indicates not on file.   Review of Systems Review of Systems  Constitutional: Negative for diaphoresis.  Cardiovascular: Positive for chest pain.  Gastrointestinal: Positive for vomiting. Negative for nausea.  Musculoskeletal: Positive for myalgias.       Localized to back of arms and over left shoulder.  All other systems  reviewed and are negative.    Physical Exam Updated Vital Signs Ht 5\' 8"  (1.727 m)   Wt 165 lb (74.8 kg)   BMI 25.09 kg/m   Physical Exam  Constitutional: She is oriented to person, place, and time. She appears well-developed and well-nourished.  HENT:  Head: Normocephalic and atraumatic.  Eyes: EOM are normal. Pupils are equal, round, and reactive to light.  Neck: Normal range of motion. Neck supple. No JVD present.  Cardiovascular: Normal rate, regular rhythm and normal heart sounds.   No murmur heard. Pulmonary/Chest: Effort normal and breath sounds normal. She has no wheezes. She has no rales. She exhibits no tenderness.  Abdominal: Soft. She exhibits no distension and no mass. There is no tenderness.  Musculoskeletal: Normal range of motion. She exhibits tenderness. She exhibits no edema.  Mild tenderness to left parasternal area.  Lymphadenopathy:    She has no cervical adenopathy.  Neurological: She is alert and oriented to person, place, and time. No cranial nerve deficit. She exhibits normal muscle tone. Coordination normal.  Skin: Skin is warm and dry. No rash noted.  Psychiatric: She has a normal mood and affect. Her behavior is normal. Judgment and thought content normal.  Nursing note and vitals reviewed.    ED Treatments / Results  DIAGNOSTIC STUDIES:  Oxygen Saturation is 99% on room air, normal by my interpretation.    COORDINATION OF CARE:  12:18 AM Discussed treatment plan with pt at bedside, which includes chest x-ray, aspirin and nitroglycerin, and pt agreed to plan.  Labs (all labs ordered are listed, but only abnormal results are displayed) Labs Reviewed  BASIC METABOLIC PANEL - Abnormal; Notable for the following:       Result Value   Potassium 3.1 (*)    All other components within normal limits  TROPONIN I - Abnormal; Notable for the following:    Troponin I 0.04 (*)    All other components within normal limits  CBC WITH  DIFFERENTIAL/PLATELET  PROTIME-INR  APTT  HEPARIN LEVEL (UNFRACTIONATED)    EKG  EKG Interpretation  Date/Time:  Friday May 14 2016 00:09:38 EDT Ventricular Rate:  75 PR Interval:    QRS Duration: 86 QT Interval:  463 QTC Calculation: 518 R Axis:   13 Text Interpretation:  Sinus rhythm Nonspecific repol abnormality, diffuse leads Prolonged QT interval No old tracing to compare Confirmed by [facility]  MD, Ivey Cina ([id number]) on 05/14/2016 12:12:10 AM       Radiology Dg Chest Port 1 View  Result Date: 05/14/2016 CLINICAL DATA:  Acute chest pain for 12 hours. EXAM: PORTABLE CHEST 1 VIEW COMPARISON:  None. FINDINGS: The cardiomediastinal silhouette is unremarkable. There is no evidence of focal airspace disease, pulmonary edema, suspicious pulmonary nodule/mass, pleural effusion, or pneumothorax. No acute bony abnormalities are identified. IMPRESSION: No active disease. Electronically Signed   By: Margarette Canada M.D.   On: 05/14/2016 01:30    Procedures Procedures (including critical care time) CRITICAL CARE Performed by: [id number] Total critical care time: 60 minutes Critical care time was exclusive of separately billable procedures and treating other patients. Critical care was necessary to treat or prevent imminent or life-threatening deterioration. Critical care was time spent personally by me on the following activities: development of treatment plan with patient and/or surrogate as well as nursing, discussions with consultants, evaluation of patient's response to treatment, examination of patient, obtaining history from patient or surrogate, ordering and performing treatments and interventions, ordering and review of laboratory studies, ordering and review of radiographic studies, pulse oximetry and re-evaluation of patient's condition.  Medications Ordered in ED Medications  nitroGLYCERIN (NITROSTAT) SL tablet 0.4 mg (0.4 mg Sublingual Given 05/14/16 0034)  nitroGLYCERIN 50 mg in  dextrose 5 % 250 mL (0.2 mg/mL) infusion (10 mcg/min Intravenous New Bag/Given 05/14/16 0127)  potassium chloride 10 mEq in 100 mL IVPB (not administered)  potassium chloride SA (K-DUR,KLOR-CON) CR tablet 40 mEq (not administered)  heparin bolus via infusion 4,000 Units (not administered)  heparin ADULT infusion 100 units/mL (25000 units/269mL sodium chloride 0.45%) (not administered)  aspirin chewable tablet 324 mg (324 mg Oral Given 05/14/16 0024)     Initial Impression / Assessment and Plan / ED Course  I have reviewed the triage vital signs and the nursing notes.  Pertinent labs & imaging results that were available during my care of the patient were reviewed by me and considered in my medical decision making (see chart for details).  Clinical Course    Arm and back discomfort with ECG changes worrisome for cardiac etiology. She does have risk factors of family history, hypertension, hyperlipidemia. She is given aspirin and sublingual nitroglycerin with complete relief of pain. Workup does show minimally elevated troponin. She is started on heparin. Pain started to recur, so she was placed on nitroglycerin drip. Hypokalemia is noted and she is given both oral and intravenous potassium. Case is discussed with Dr. Raiford Simmonds of cardiology service  who agrees to accept the patient transferred [facility].  Final Clinical Impressions(s) / ED Diagnoses   Final diagnoses:  Non-STEMI (non-ST elevated myocardial infarction) ([place])  Hypokalemia    New Prescriptions New Prescriptions   No medications on file  I personally performed the services described in this documentation, which was scribed in my presence. The recorded information has been reviewed and is accurate.       Delora Fuel, MD [id number]

## 2016-05-14 NOTE — Progress Notes (Signed)
ANTICOAGULATION CONSULT NOTE - Follow Up Consult  Pharmacy Consult for Heparin Indication: chest pain/ACS  Allergies  Allergen Reactions  . Atorvastatin   . Codeine Nausea And Vomiting  . Erythromycin   . Morphine And Related Nausea And Vomiting  . Niacin And Related   . Statins     Patient Measurements: Height: 5\' 8"  (172.7 cm) Weight: 165 lb (74.8 kg) IBW/kg (Calculated) : 63.9 Heparin Dosing Weight: 74.8 kg  Vital Signs: Temp: 98.3 F (36.8 C) (08/11 0855) Temp Source: Oral (08/11 0855) BP: 133/79 (08/11 0855) Pulse Rate: 61 (08/11 0855)  Labs:  Recent Labs  05/14/16 0015 05/14/16 0330 05/14/16 0745  HGB 12.4  --  11.3*  HCT 37.1  --  35.5*  PLT 341  --  301  APTT 33  --   --   LABPROT 13.2 13.9  --   INR 1.00 1.07  --   HEPARINUNFRC  --   --  0.89*  CREATININE 0.87  --  0.72  TROPONINI 0.04* 0.24*  --     Estimated Creatinine Clearance: 68.8 mL/min (by C-G formula based on SCr of 0.8 mg/dL).   Medications:  Scheduled:  . [START ON 05/15/2016] aspirin EC  81 mg Oral Daily  . diltiazem  120 mg Oral Q12H  . hydrochlorothiazide  12.5 mg Oral Daily  . potassium chloride  20 mEq Oral Daily   Infusions:  . sodium chloride    . heparin 1,000 Units/hr (05/14/16 0200)  . nitroGLYCERIN      Assessment: 70 yo F with CP initiated on heparin overnight.  Initial heparin level is slightly above goal.  Will reduce infusion rate accordingly.  Noted plans for cardiac cath today.  Goal of Therapy:  Heparin level 0.3-0.7 units/ml Monitor platelets by anticoagulation protocol: Yes   Plan:  Reduce heparin to 850 units/hr Will not order repeat heparin level as patient scheduled for cath at 1500 today. Follow-up after cath.  [employer], Pharm.D., BCPS Clinical Pharmacist Pager [phone] 05/14/2016 10:44 AM

## 2016-05-14 NOTE — ED Triage Notes (Signed)
Pt c/o dull pain in the back of both arms and some in her chest, also pain in her back that started this am, has been intermittent through the day.

## 2016-05-14 NOTE — Progress Notes (Signed)
ANTICOAGULATION CONSULT NOTE - Initial Consult  Pharmacy Consult for heparin Indication: chest pain/ACS  Allergies  Allergen Reactions  . Atorvastatin     Patient Measurements: Height: 5\' 8"  (172.7 cm) Weight: 165 lb (74.8 kg) IBW/kg (Calculated) : 63.9 Heparin Dosing Weight: 74.8  Vital Signs: Temp: 97.4 F (36.3 C) (08/11 0014) Temp Source: Oral (08/11 0014) BP: 148/98 (08/11 0045) Pulse Rate: 75 (08/11 0045)  Labs:  Recent Labs  05/14/16 0015  HGB 12.4  HCT 37.1  PLT 341  CREATININE 0.87  TROPONINI 0.04*    Estimated Creatinine Clearance: 63.3 mL/min (by C-G formula based on SCr of 0.87 mg/dL).   Medical History: Past Medical History:  Diagnosis Date  . Atrial fibrillation ([place])   . Hypertension     Medications:  Scheduled:  . potassium chloride SA  40 mEq Oral Once   Infusions:  . nitroGLYCERIN    . potassium chloride      Assessment: 70 yo F in ED with intermittent Chest pain radiating to both arms and back.  Troponin elevated.  Noted plans to transfer patient to [facility].  Goal of Therapy:  Heparin level 0.3-0.7 units/ml Monitor platelets by anticoagulation protocol: Yes   Plan:  - Heparin 4000 units IV x 1 - Heparin infusion at 1000 units/hr - Check 6h heparin level - Daily heparin level and CBC - Check PTT, PT/INR now  Vonda Antigua 05/14/2016,1:21 AM

## 2016-05-14 NOTE — ED Notes (Signed)
CRITICAL VALUE ALERT  Critical value received: troponin- 0.04  Date of notification:  05/14/16  Time of notification:  0104  Critical value read back:Yes.    Nurse who received alert:  Idelia Salm, RN  MD notified (1st page):  0104  Time of first page:  0104  MD notified (2nd page):  Time of second page:  Responding MD:  Dr Roxanne Mins  Time MD responded:  [phone]

## 2016-05-14 NOTE — Progress Notes (Signed)
CRITICAL VALUE ALERT  Critical value received:  Tropin 0.24  Date of notification:  05/14/16  Time of notification:  [id number]  Critical value read back:Yes.    Nurse who received alert:  Saddie Benders   MD notified (1st page):  Fudim  Time of first page:  [phone]  MD notified (2nd page):  Time of second page:  Responding MD:    Time MD responded:    No changes with the client otherwise. Nitro gtt and heparin gtt infusing, still pain free. No new orders placed at this time.   Saddie Benders RN

## 2016-05-14 NOTE — Progress Notes (Signed)
Called in to pt's room by family member for pt c/o N/V. VS obtained. Pt diaphoretic. Vomited 50cc's clear colored emesis. Monitor shows NSR.  Cold compress applied to head. 19:39 Zofran 4 mg administered IV as ordered.

## 2016-05-15 ENCOUNTER — Other Ambulatory Visit: Payer: Self-pay | Admitting: Student

## 2016-05-15 ENCOUNTER — Encounter (HOSPITAL_COMMUNITY): Payer: Self-pay | Admitting: Student

## 2016-05-15 DIAGNOSIS — I10 Essential (primary) hypertension: Secondary | ICD-10-CM

## 2016-05-15 DIAGNOSIS — E785 Hyperlipidemia, unspecified: Secondary | ICD-10-CM

## 2016-05-15 LAB — BASIC METABOLIC PANEL
ANION GAP: 11 (ref 5–15)
BUN: 10 mg/dL (ref 6–20)
CHLORIDE: 103 mmol/L (ref 101–111)
CO2: 25 mmol/L (ref 22–32)
Calcium: 9.7 mg/dL (ref 8.9–10.3)
Creatinine, Ser: 0.91 mg/dL (ref 0.44–1.00)
GFR calc non Af Amer: >60 mL/min (ref >60)
Glucose, Bld: 94 mg/dL (ref 65–99)
POTASSIUM: 3.7 mmol/L (ref 3.5–5.1)
SODIUM: 139 mmol/L (ref 135–145)

## 2016-05-15 LAB — HEPATIC FUNCTION PANEL
ALBUMIN: 3.3 g/dL — AB (ref 3.5–5.0)
ALK PHOS: 78 U/L (ref 38–126)
ALT: 14 U/L (ref 14–54)
AST: 22 U/L (ref 15–41)
Bilirubin, Direct: <0.1 mg/dL — ABNORMAL LOW (ref 0.1–0.5)
TOTAL PROTEIN: 6.7 g/dL (ref 6.5–8.1)
Total Bilirubin: 0.5 mg/dL (ref 0.3–1.2)

## 2016-05-15 LAB — CBC
HCT: 37.7 % (ref 36.0–46.0)
HEMOGLOBIN: 12 g/dL (ref 12.0–15.0)
MCH: 29.4 pg (ref 26.0–34.0)
MCHC: 31.8 g/dL (ref 30.0–36.0)
MCV: 92.4 fL (ref 78.0–100.0)
PLATELETS: 306 10*3/uL (ref 150–400)
RBC: 4.08 MIL/uL (ref 3.87–5.11)
RDW: 12.5 % (ref 11.5–15.5)
WBC: 6.8 10*3/uL (ref 4.0–10.5)

## 2016-05-15 LAB — LIPID PANEL
CHOL/HDL RATIO: 7.7 ratio
Cholesterol: 292 mg/dL — ABNORMAL HIGH (ref 0–200)
HDL: 38 mg/dL — ABNORMAL LOW (ref >40)
LDL Cholesterol: 211 mg/dL — ABNORMAL HIGH (ref 0–99)
Triglycerides: 213 mg/dL — ABNORMAL HIGH (ref <150)
VLDL: 43 mg/dL — AB (ref 0–40)

## 2016-05-15 MED ORDER — EZETIMIBE 10 MG PO TABS
10.0000 mg | ORAL_TABLET | Freq: Every day | ORAL | Status: DC
  Administered 2016-05-15: 10 mg via ORAL
  Filled 2016-05-15: qty 1

## 2016-05-15 MED ORDER — METOPROLOL TARTRATE 12.5 MG HALF TABLET
12.5000 mg | ORAL_TABLET | Freq: Two times a day (BID) | ORAL | Status: DC
Start: 2016-05-15 — End: 2016-05-15
  Administered 2016-05-15: 10:00:00 12.5 mg via ORAL
  Filled 2016-05-15: qty 1

## 2016-05-15 MED ORDER — TICAGRELOR 90 MG PO TABS: 90.0000 mg | ORAL_TABLET | Freq: Two times a day (BID) | ORAL | 0 refills | Status: DC

## 2016-05-15 MED ORDER — LISINOPRIL 5 MG PO TABS: 5.0000 mg | ORAL_TABLET | Freq: Every day | ORAL | 6 refills | Status: DC

## 2016-05-15 MED ORDER — NITROGLYCERIN 0.4 MG SL SUBL: 0.4000 mg | SUBLINGUAL_TABLET | SUBLINGUAL | 3 refills | Status: DC | PRN

## 2016-05-15 MED ORDER — LISINOPRIL 5 MG PO TABS
5.0000 mg | ORAL_TABLET | Freq: Every day | ORAL | Status: DC
Start: 2016-05-15 — End: 2016-05-15
  Administered 2016-05-15: 10:00:00 5 mg via ORAL
  Filled 2016-05-15: qty 1

## 2016-05-15 MED ORDER — ASPIRIN 81 MG PO TBEC: 81.0000 mg | DELAYED_RELEASE_TABLET | Freq: Every day | ORAL | Status: AC

## 2016-05-15 MED ORDER — TICAGRELOR 90 MG PO TABS: 90.0000 mg | ORAL_TABLET | Freq: Two times a day (BID) | ORAL | 10 refills | Status: DC

## 2016-05-15 MED ORDER — METOPROLOL TARTRATE 25 MG PO TABS: 12.5000 mg | ORAL_TABLET | Freq: Two times a day (BID) | ORAL | 6 refills | Status: DC

## 2016-05-15 MED ORDER — EZETIMIBE 10 MG PO TABS: 10.0000 mg | ORAL_TABLET | Freq: Every day | ORAL | 6 refills | Status: DC

## 2016-05-15 NOTE — Progress Notes (Addendum)
CARDIAC REHAB PHASE I   PRE:  Rate/Rhythm: 73 SR  BP:  Supine:   Sitting: 174/88  Standing:    SaO2:   MODE:  Ambulation: 1000 ft   POST:  Rate/Rhythm: 90 SR  BP:  Supine:   Sitting: 188/90 recheck BP 147/101  Standing:    SaO2:  0810-0920 Completed MI and stent education with pt. She voices understanding. Will send referral to Outpt. CRP in [place]. BP reported to RN.  Rodney Langton RN 05/15/2016 9:40 AM

## 2016-05-15 NOTE — Progress Notes (Signed)
Pt given all discharge orders and verbalized understanding.  Haed prescriptions given.  Right radial site is level 1 from mild bruising.  No complaints of chest pain at dischg.  Discharged via wheelchair with daughter to home. All belongings with pt.

## 2016-05-15 NOTE — Progress Notes (Signed)
Subjective:    No chest pain or SOB overnight. Mild nausea with dinner now resolved.   Objective:   Temp:  [96.9 F (36.1 C)-98.3 F (36.8 C)] 96.9 F (36.1 C) (08/12 0346) Pulse Rate:  [0-241] 63 (08/12 0600) Resp:  [0-55] 19 (08/12 0600) BP: (122-185)/(61-100) 148/88 (08/12 0600) SpO2:  [0 %-100 %] 96 % (08/12 0600) Weight:  [165 lb (74.8 kg)-170 lb 10.2 oz (77.4 kg)] 170 lb 10.2 oz (77.4 kg) (08/12 0346) Last BM Date: 05/13/16  Filed Weights   05/14/16 0328 05/14/16 1005 05/15/16 0346  Weight: 163 lb 3.2 oz (74 kg) 165 lb (74.8 kg) 170 lb 10.2 oz (77.4 kg)    Intake/Output Summary (Last 24 hours) at 05/15/16 0714 Last data filed at 05/15/16 0347  Gross per 24 hour  Intake              621 ml  Output             2550 ml  Net            -1929 ml    Telemetry: SR  Exam:  General: NAD  HEENT: sclera clear, throat clear  Resp: CTAB  Cardiac: RRR, no m/r/g, no jvd  GI: abdomen soft, NT, ND  MSK: no LE edema  Neuro: no focal deficits  Psych: appropriate affect Lab Results:  Basic Metabolic Panel:  Recent Labs Lab 05/14/16 0015 05/14/16 0745 05/15/16 0411  NA 139 141 139  K 3.1* 3.7 3.7  CL 106 108 103  CO2 27 25 25   GLUCOSE 97 89 94  BUN 8 5* 10  CREATININE 0.87 0.72 0.91  CALCIUM 9.1 9.0 9.7    Liver Function Tests:  Recent Labs Lab 05/15/16 0411  AST 22  ALT 14  ALKPHOS 78  BILITOT 0.5  PROT 6.7  ALBUMIN 3.3*    CBC:  Recent Labs Lab 05/14/16 0015 05/14/16 0745 05/15/16 0411  WBC 6.0 6.3 6.8  HGB 12.4 11.3* 12.0  HCT 37.1 35.5* 37.7  MCV 91.2 92.4 92.4  PLT 341 301 306    Cardiac Enzymes:  Recent Labs Lab 05/14/16 0330 05/14/16 1315 05/14/16 2038  TROPONINI 0.24* 0.57* 0.58*    BNP: No results for input(s): PROBNP in the last 8760 hours.  Coagulation:  Recent Labs Lab 05/14/16 0015 05/14/16 0330  INR 1.00 1.07    ECG:   Medications:   Scheduled Medications: . amLODipine  5 mg Oral Daily  .  aspirin EC  81 mg Oral Daily  . hydrochlorothiazide  12.5 mg Oral Daily  . potassium chloride  20 mEq Oral Daily  . sodium chloride flush  3 mL Intravenous Q12H  . ticagrelor  90 mg Oral BID     Infusions:     PRN Medications:  sodium chloride, acetaminophen, nitroGLYCERIN, ondansetron (ZOFRAN) IV, sodium chloride flush     Assessment/Plan   1. Chest pain/NSTEMI - cath yesterday, severe ramus stenosis s/p DES. - echo this admit LVEF 55-60%, basalinferior hypokinesis.    - continue DAPT one year - medical therapy witth ASA, brillinta. Intolerant to statins. Start low dose lisinopril and lopressor   2. Hyperlipidemia - intolerant to statins - LDL of 211 this admit - we will start trial of zetia 10mg  daily. Can consider pcsk9inhibitor in the future   3. HTN - will d/c norvasc, start ACE-I in setting of CAD. Stop dilt, start lopressor with recent NSTEMI  4. Bradycardia - HRs 40s yesterday, 2.1 sec pauses.  She was on dilt at the time. Discontinued, we will try low dose beta blocker to see how tolerated.    She will need outpatient f/u in 2 weeks.    Carlyle Dolly, M.D.

## 2016-05-15 NOTE — Discharge Summary (Signed)
Discharge Summary    Patient ID: Tricia Savage,  MRN: [id number], DOB/AGE: [birth year] 70 y.o.  Admit date: 05/14/2016 Discharge date: 05/15/2016  Primary Care Provider: Robert Bellow Primary Cardiologist: New - F/U in [employer]  Discharge Diagnoses    Principal Problem:   Non-STEMI (non-ST elevated myocardial infarction) [facility]) Active Problems:   Dyslipidemia, goal LDL below 70   Essential hypertension   History of Present Illness     Tricia Savage is a 70 y.o. female with past medical history of HTN, HLD (statin intolerant)m and remote history of atrial fibrillation in 2000 who presented to [facility] from an OSH on 05/14/2016 for new onset chest pain.  She reported the pain began while she was driving and was associated with nausea, vomiting, and pain down her left arm. She reported being active at baseline and denied any previous episodes of chest discomfort.   At the OSH, her initial EKG showed diffuse ST depression and her initial troponin was mildly elevated at 0.04. Therefore, she was started on Heparin and a NTG drip and transferred to [facility] for further evaluation.   Hospital Course     Consultants: None  Later that morning, she denied any repeat episodes of chest discomfort. Cyclic troponin values continued to trend upwards to 0.58. A cardiac catheterization was recommended for definitive evaluation. The risks and benefits of the procedure were discussed and she agreed to proceed.   Her catheterization showed 99% stenosis of the RI and PCI was performed with placement of a Promus DES. Successful intervention with a 0% residual stenosis. Mild non-obstructive disease in the RCA was noted along with moderate 70% stenosis along a small caliber Diagonal branch. She tolerated the procedure well and no complications were noted. She will be on DAPT with ASA and Brilinta for one year. She is statin intolerant, therefore a PCSK9-inhibitor should be considered following  discharge.  The following morning, she denied any repeat episodes of chest discomfort or dyspnea. Did have mild nausea after consuming dinner but it resolved spontaneously and did not represent. A Lipid Panel was checked and showed an LDL of 211 this admission. She has been started on a trial of Zetia 10mg  daily.   She was on Cardizem CD prior to admission but this had been discontinued due to bradycardia and sinus pauses up to 2 seconds. She had been started on Amlodipine prior to cath but with known CAD, this was switched to Lopressor 12.5mg  BID.  BP remained elevated, therefore she was also placed on Lisinopril 5mg  daily. With the patient already being on HCTZ PTA, will discontinue the inpatient K+ supplement at this time. She will need a repeat BMET at time of follow-up to assess creatinine and K+.   She was last examined by Dr. Harl Bowie and deemed stable for discharge. Case Management was consulted in regards to the patient's Brilinta and the patient says she will not be able to afford the co-pay. Attempted to obtain prior authorization prior to discharge but the office is closed over the weekend. Will provide written Rx to use with 30-day card for Brilinta. Can attempt to get enrolled in patient assistance program as an outpatient, otherwise will need to switch to Plavix. A staff message has been sent to the [place] location to arrange follow-up.  _____________  Discharge Vitals Blood pressure (!) 144/83, pulse 69, temperature 97.4 F (36.3 C), temperature source Oral, resp. rate (!) 21, height 5\' 8"  (1.727 m), weight 170 lb 10.2  oz (77.4 kg), SpO2 98 %.  Filed Weights   05/14/16 0328 05/14/16 1005 05/15/16 0346  Weight: 163 lb 3.2 oz (74 kg) 165 lb (74.8 kg) 170 lb 10.2 oz (77.4 kg)    Labs & Radiologic Studies     CBC  Recent Labs  05/14/16 0015 05/14/16 0745 05/15/16 0411  WBC 6.0 6.3 6.8  NEUTROABS 2.9  --   --   HGB 12.4 11.3* 12.0  HCT 37.1 35.5* 37.7  MCV 91.2 92.4 92.4    PLT 341 301 [id number]   Basic Metabolic Panel  Recent Labs  05/14/16 0745 05/15/16 0411  NA 141 139  K 3.7 3.7  CL 108 103  CO2 25 25  GLUCOSE 89 94  BUN 5* 10  CREATININE 0.72 0.91  CALCIUM 9.0 9.7   Liver Function Tests  Recent Labs  05/15/16 0411  AST 22  ALT 14  ALKPHOS 78  BILITOT 0.5  PROT 6.7  ALBUMIN 3.3*   No results for input(s): LIPASE, AMYLASE in the last 72 hours. Cardiac Enzymes  Recent Labs  05/14/16 0330 05/14/16 1315 05/14/16 2038  TROPONINI 0.24* 0.57* 0.58*   BNP Invalid input(s): POCBNP D-Dimer No results for input(s): DDIMER in the last 72 hours. Hemoglobin A1C No results for input(s): HGBA1C in the last 72 hours. Fasting Lipid Panel  Recent Labs  05/15/16 0411  CHOL 292*  HDL 38*  LDLCALC 211*  TRIG 213*  CHOLHDL 7.7   Thyroid Function Tests No results for input(s): TSH, T4TOTAL, T3FREE, THYROIDAB in the last 72 hours.  Invalid input(s): FREET3  Dg Chest Port 1 View  Result Date: 05/14/2016 CLINICAL DATA:  Acute chest pain for 12 hours. EXAM: PORTABLE CHEST 1 VIEW COMPARISON:  None. FINDINGS: The cardiomediastinal silhouette is unremarkable. There is no evidence of focal airspace disease, pulmonary edema, suspicious pulmonary nodule/mass, pleural effusion, or pneumothorax. No acute bony abnormalities are identified. IMPRESSION: No active disease. Electronically Signed   By: Margarette Canada M.D.   On: 05/14/2016 01:30     Diagnostic Studies/Procedures     Cardiac Catheterization: 05/14/2016  Prox RCA to Mid RCA lesion, 40 %stenosed.  Ost Ramus lesion, 30 %stenosed.  A STENT PROMUS PREM MR 2.25X16 drug eluting stent was successfully placed.  Ramus lesion, 99 %stenosed.  Post intervention, there is a 0% residual stenosis.  1st Diag lesion, 70 %stenosed.  The left ventricular systolic function is normal.  LV end diastolic pressure is normal.  The left ventricular ejection fraction is 55-65% by visual estimate.  There  is no mitral valve regurgitation.   1. Severe stenosis mid Ramus intermediate branch, culprit for NSTEMI 2. Mild non-obstructive disease in the RCA.  3. Moderate stenosis small caliber Diagonal branch 4. Normal LV systolic function 5. Successful PTCA/DES x 1 mid Ramus intermediate branch  Recommendations: Continue DAPT with ASA and Brilinta for one year. She is statin intolerant. Consider PCSK9-inh after discharge. Start beta blocker. Follow up in [facility] office.   Echocardiogram: 05/14/2016 Study Conclusions  - Left ventricle: The cavity size was normal. Wall thickness was   increased in a pattern of mild LVH. Systolic function was normal.   The estimated ejection fraction was in the range of 55% to 60%.   There is akinesis of the basalinferior myocardium. The study is   not technically sufficient to allow evaluation of LV diastolic   function. - Mitral valve: Calcified annulus.  Impressions:  - Akinesis of the basal inferior wall with overall normal  LV   function; mild LVH; trace MR.    Disposition   Pt is being discharged home today in good condition.  Follow-up Plans & Appointments    Follow-up Information    [facility] .   Specialty:  Cardiology Why:  The office should call you within 3 business days with an appointment date and time. If you do not hear from them, please call the number provided. Contact information: [place] [phone]         Discharge Instructions    Diet - low sodium heart healthy    Complete by:  As directed   Discharge instructions    Complete by:  As directed   PLEASE REMEMBER TO BRING ALL OF YOUR MEDICATIONS TO EACH OF YOUR FOLLOW-UP OFFICE VISITS.  PLEASE ATTEND ALL SCHEDULED FOLLOW-UP APPOINTMENTS.   Activity: Increase activity slowly as tolerated. You may shower, but no soaking baths (or swimming) for 1 week. No driving for 24 hours. No lifting over 5 lbs  for 1 week. No sexual activity for 1 week.   You May Return to Work: in 1 week (if applicable)  Wound Care: You may wash cath site gently with soap and water. Keep cath site clean and dry. If you notice pain, swelling, bleeding or pus at your cath site, please call [phone].   Increase activity slowly    Complete by:  As directed      Discharge Medications     Medication List    STOP taking these medications   diltiazem 120 MG tablet Commonly known as:  CARDIZEM     TAKE these medications   alendronate 70 MG tablet Commonly known as:  FOSAMAX Take 70 mg by mouth once a week. Take with a full glass of water on an empty stomach.   aspirin 81 MG EC tablet Take 1 tablet (81 mg total) by mouth daily.   ezetimibe 10 MG tablet Commonly known as:  ZETIA Take 1 tablet (10 mg total) by mouth daily.   hydrochlorothiazide 12.5 MG tablet Commonly known as:  HYDRODIURIL Take 12.5 mg by mouth daily.   lisinopril 5 MG tablet Commonly known as:  PRINIVIL,ZESTRIL Take 1 tablet (5 mg total) by mouth daily.   metoprolol tartrate 25 MG tablet Commonly known as:  LOPRESSOR Take 0.5 tablets (12.5 mg total) by mouth 2 (two) times daily.   nitroGLYCERIN 0.4 MG SL tablet Commonly known as:  NITROSTAT Place 1 tablet (0.4 mg total) under the tongue every 5 (five) minutes x 3 doses as needed for chest pain.   ticagrelor 90 MG Tabs tablet Commonly known as:  BRILINTA Take 1 tablet (90 mg total) by mouth 2 (two) times daily.       Aspirin prescribed at discharge?  Yes High Intensity Statin Prescribed? (Lipitor 40-80mg  or Crestor 20-40mg ): No: Statin Intolerant, started on Zetia.  Beta Blocker Prescribed? Yes For EF 40% or less, Was ACEI/ARB Prescribed? Yes ADP Receptor Inhibitor Prescribed? (i.e. Plavix etc.-Includes Medically Managed Patients): Yes For EF <40%, Aldosterone Inhibitor Prescribed? No: N/A Was EF assessed during THIS hospitalization? Yes - Cardiac Catheterization and  Echo Was Cardiac Rehab II ordered? (Included Medically managed Patients): Yes   Allergies Allergies  Allergen Reactions  . Atorvastatin   . Codeine Nausea And Vomiting  . Erythromycin   . Morphine And Related Nausea And Vomiting  . Niacin And Related   . Statins      Outstanding Labs/Studies   BMET  at hospital follow-up.   Duration of Discharge Encounter   Greater than 30 minutes including physician time.  Signed, Erma Heritage, PA-C 05/15/2016, 8:22 AM

## 2016-05-17 MED FILL — Nitroglycerin IV Soln 100 MCG/ML in D5W: INTRA_ARTERIAL | Qty: 10 | Status: AC

## 2016-05-31 ENCOUNTER — Encounter: Payer: Self-pay | Admitting: Cardiology

## 2016-05-31 ENCOUNTER — Ambulatory Visit (INDEPENDENT_AMBULATORY_CARE_PROVIDER_SITE_OTHER): Payer: Medicare Other | Admitting: Cardiology

## 2016-05-31 DIAGNOSIS — I10 Essential (primary) hypertension: Secondary | ICD-10-CM | POA: Diagnosis not present

## 2016-05-31 DIAGNOSIS — E785 Hyperlipidemia, unspecified: Secondary | ICD-10-CM

## 2016-05-31 DIAGNOSIS — I2510 Atherosclerotic heart disease of native coronary artery without angina pectoris: Secondary | ICD-10-CM

## 2016-05-31 DIAGNOSIS — Z9861 Coronary angioplasty status: Secondary | ICD-10-CM

## 2016-05-31 DIAGNOSIS — I214 Non-ST elevation (NSTEMI) myocardial infarction: Secondary | ICD-10-CM

## 2016-05-31 NOTE — Assessment & Plan Note (Signed)
RI DES 05/14/16. Residual 40% RCA, 70% Dx1, normal LVF

## 2016-05-31 NOTE — Patient Instructions (Signed)
Your physician recommends that you continue on your current medications as directed. Please refer to the Current Medication list given to you today.  Your physician recommends that you schedule a follow-up appointment in: [place] DR. BRANCH   Thanks for choosing [facility]!!!

## 2016-05-31 NOTE — Assessment & Plan Note (Addendum)
Statin intolerance- Lipitor- myalgia Pravachol-trouble swallowing Crestor- myalgia

## 2016-05-31 NOTE — Assessment & Plan Note (Signed)
05/14/16- She presented with subscapular pain, and pain down her arms (no chest pain), transferred to [facility] from [facility]

## 2016-05-31 NOTE — Assessment & Plan Note (Signed)
Controlled. Diltiazem changed to Lisinopril and she has a slight dry cough

## 2016-05-31 NOTE — Progress Notes (Signed)
05/31/2016 Tricia Savage   [birth year]  [id number]  Primary Physician Robert Bellow, MD Primary Cardiologist: Dr Harl Bowie (new)  HPI:  70 y/o AA female with a history of remote PAF in 2000. She says she was transferred from [facility] to [facility] and had "my heart checked out". Rx'd for HTN and hasn't had recurrent PAF. She presented to [facility] 05/14/16 with pain in her back and down her arms and was transferred to [facility] as a NSTEMI. Cath/ PCI done 05/14/16. She had high grade RI disease treated with a DES. She had residual Dx and RCA disease treated medically. Her medications were adjusted- Diltiazem stopped, Lopressor added, Zetia added. She is in the office today for follow up. She is doing well, no further back discomfort. She has a dry cough but not too bad. She has mild SOB attributable to Brilinta.    Current Outpatient Prescriptions  Medication Sig Dispense Refill  . alendronate (FOSAMAX) 70 MG tablet Take 70 mg by mouth once a week. Take with a full glass of water on an empty stomach.    Marland Kitchen aspirin EC 81 MG EC tablet Take 1 tablet (81 mg total) by mouth daily.    . carbamazepine (CARBATROL) 200 MG 12 hr capsule Take 200 mg by mouth 2 (two) times daily.    Marland Kitchen EPINEPHrine 0.3 mg/0.3 mL IJ SOAJ injection Inject 0.3 mg into the muscle once.    . ezetimibe (ZETIA) 10 MG tablet Take 1 tablet (10 mg total) by mouth daily. 30 tablet 6  . hydrochlorothiazide (HYDRODIURIL) 12.5 MG tablet Take 12.5 mg by mouth daily.    Marland Kitchen lisinopril (PRINIVIL,ZESTRIL) 5 MG tablet Take 1 tablet (5 mg total) by mouth daily. 30 tablet 6  . metoprolol tartrate (LOPRESSOR) 25 MG tablet Take 0.5 tablets (12.5 mg total) by mouth 2 (two) times daily. 60 tablet 6  . nitroGLYCERIN (NITROSTAT) 0.4 MG SL tablet ONE TABLET UNDER TONGUE AS NEEDED FOR CHEST PAIN EVERY 5 MINUTES FOR MAX OF 3 DOSES 90 tablet 1  . ticagrelor (BRILINTA) 90 MG TABS tablet Take 1 tablet (90 mg total) by mouth 2 (two) times daily. 60 tablet 10   No current  facility-administered medications for this visit.     Allergies  Allergen Reactions  . Bee Venom Anaphylaxis and Swelling  . Atorvastatin   . Codeine Nausea And Vomiting  . Erythromycin   . Milk-Related Compounds Other (See Comments)    Rash in inside of mouth , digestive issues  . Morphine And Related Nausea And Vomiting  . Niacin And Related   . Statins     Social History   Social History  . Marital status: Divorced    Spouse name: N/A  . Number of children: N/A  . Years of education: N/A   Occupational History  . Not on file.   Social History Main Topics  . Smoking status: Never Smoker  . Smokeless tobacco: Never Used  . Alcohol use No  . Drug use: No  . Sexual activity: Not Currently   Other Topics Concern  . Not on file   Social History Narrative  . No narrative on file     Review of Systems: General: negative for chills, fever, night sweats or weight changes.  Cardiovascular: negative for chest pain, dyspnea on exertion, edema, orthopnea, palpitations, paroxysmal nocturnal dyspnea or shortness of breath Dermatological: negative for rash Respiratory: negative for wheezing Urologic: negative for hematuria Abdominal: negative for nausea, vomiting, diarrhea, bright red blood  per rectum, melena, or hematemesis Neurologic: negative for visual changes, syncope, or dizziness All other systems reviewed and are otherwise negative except as noted above.    Blood pressure 126/72, pulse 65, height 5\' 9"  (1.753 m), weight 158 lb (71.7 kg), SpO2 97 %.  General appearance: alert, cooperative, no distress and thin Neck: no carotid bruit and no JVD Lungs: clear to auscultation bilaterally Heart: regular rate and rhythm Extremities: extremities normal, atraumatic, no cyanosis or edema Neurologic: Grossly normal   ASSESSMENT AND PLAN:   Non-STEMI (non-ST elevated myocardial infarction) ([place]) 05/14/16- She presented with subscapular pain, and pain down her arms (no  chest pain), transferred to [facility] from [facility]  CAD S/P PCI RI DES 05/14/16. Residual 40% RCA, 70% Dx1, normal LVF  Essential hypertension Controlled. Diltiazem changed to Lisinopril and she has a slight dry cough  Dyslipidemia, goal LDL below 70 Statin intolerance- Lipitor- myalgia Pravachol-trouble swallowing Crestor- myalgia   PLAN  Continue current medications. I offered to change her to an ARB but she felt like her dry cough was not an issue currently. She may be a candidate for a PCSK9 inhibitor in the future. F/U with Dr Harl Bowie in 6 weeks.  Kerin Ransom PA-C 05/31/2016 3:05 PM

## 2016-07-19 ENCOUNTER — Encounter: Payer: Self-pay | Admitting: Cardiology

## 2016-07-19 ENCOUNTER — Ambulatory Visit (INDEPENDENT_AMBULATORY_CARE_PROVIDER_SITE_OTHER): Payer: Medicare Other | Admitting: Cardiology

## 2016-07-19 VITALS — BP 138/84 | HR 67 | Ht 69.0 in | Wt 160.0 lb

## 2016-07-19 DIAGNOSIS — E782 Mixed hyperlipidemia: Secondary | ICD-10-CM

## 2016-07-19 DIAGNOSIS — I2510 Atherosclerotic heart disease of native coronary artery without angina pectoris: Secondary | ICD-10-CM | POA: Diagnosis not present

## 2016-07-19 DIAGNOSIS — R05 Cough: Secondary | ICD-10-CM | POA: Diagnosis not present

## 2016-07-19 DIAGNOSIS — Z23 Encounter for immunization: Secondary | ICD-10-CM

## 2016-07-19 DIAGNOSIS — R059 Cough, unspecified: Secondary | ICD-10-CM

## 2016-07-19 NOTE — Progress Notes (Signed)
Clinical Summary Tricia Savage is a 70 y.o.female last seen by PA Kilroy, this is our first visit together. She is seen for the following medical problems.  1. CAD - admit NSTEMI 05/2016, received DES to ramus. Echo at that time LVEF 55-60%, basalinferior hypokinesis.  - beta blocker limited due to bradycardia - difficultly affording brillinta, she was to enroll in patient assistance.  - dry cough last visit, unclear if related to ACE-I. She did not want to change to ARB at that time.   - denies any chest pain. Since admission has had some intermittent SOB,especially in hot weather which is new. No history of tobacco. She is hesitant to change meds due to previous allergies.  - walks 3 times a week for about 1 hour.    2. Hyperlipidemia - statin intolerant. Started on zetia during 05/2016 admission.    Past Medical History:  Diagnosis Date  . Atrial fibrillation ([place])    a. occurred in 2000, never placed on anti-coagulation. No known recurrence.   Marland Kitchen CAD (coronary artery disease)    a. cath 05/2016: 99% stenosis RI (DES placed), mild non-obstructive disease RCA, 70% along small caliber diagonal Tricia Savage  . HLD (hyperlipidemia)    a. intoelrant to statin therapy  . Hypertension      Allergies  Allergen Reactions  . Bee Venom Anaphylaxis and Swelling  . Atorvastatin   . Codeine Nausea And Vomiting  . Erythromycin   . Milk-Related Compounds Other (See Comments)    Rash in inside of mouth , digestive issues  . Morphine And Related Nausea And Vomiting  . Niacin And Related   . Statins      Current Outpatient Prescriptions  Medication Sig Dispense Refill  . alendronate (FOSAMAX) 70 MG tablet Take 70 mg by mouth once a week. Take with a full glass of water on an empty stomach.    Marland Kitchen aspirin EC 81 MG EC tablet Take 1 tablet (81 mg total) by mouth daily.    . carbamazepine (CARBATROL) 200 MG 12 hr capsule Take 200 mg by mouth 2 (two) times daily.    Marland Kitchen EPINEPHrine 0.3 mg/0.3 mL IJ  SOAJ injection Inject 0.3 mg into the muscle once.    . ezetimibe (ZETIA) 10 MG tablet Take 1 tablet (10 mg total) by mouth daily. 30 tablet 6  . hydrochlorothiazide (HYDRODIURIL) 12.5 MG tablet Take 12.5 mg by mouth daily.    Marland Kitchen lisinopril (PRINIVIL,ZESTRIL) 5 MG tablet Take 1 tablet (5 mg total) by mouth daily. 30 tablet 6  . metoprolol tartrate (LOPRESSOR) 25 MG tablet Take 0.5 tablets (12.5 mg total) by mouth 2 (two) times daily. 60 tablet 6  . nitroGLYCERIN (NITROSTAT) 0.4 MG SL tablet ONE TABLET UNDER TONGUE AS NEEDED FOR CHEST PAIN EVERY 5 MINUTES FOR MAX OF 3 DOSES 90 tablet 1  . ticagrelor (BRILINTA) 90 MG TABS tablet Take 1 tablet (90 mg total) by mouth 2 (two) times daily. 60 tablet 10   No current facility-administered medications for this visit.      Past Surgical History:  Procedure Laterality Date  . ABDOMINAL HYSTERECTOMY    . CARDIAC CATHETERIZATION N/A 05/14/2016   Procedure: Left Heart Cath and Coronary Angiography;  Surgeon: Burnell Blanks, MD;  Location: [place] [facility];  Service: Cardiovascular;  Laterality: N/A;  . CARDIAC CATHETERIZATION N/A 05/14/2016   Procedure: Coronary Stent Intervention;  Surgeon: Burnell Blanks, MD;  Location: [place] [facility];  Service: Cardiovascular;  Laterality:  N/A;     Allergies  Allergen Reactions  . Bee Venom Anaphylaxis and Swelling  . Atorvastatin   . Codeine Nausea And Vomiting  . Erythromycin   . Milk-Related Compounds Other (See Comments)    Rash in inside of mouth , digestive issues  . Morphine And Related Nausea And Vomiting  . Niacin And Related   . Statins       Family History  Problem Relation Age of Onset  . Heart Problems Mother   . Stroke Father   . Diabetes Father   . Heart attack Brother   . Heart attack Maternal Grandfather   . Heart attack Brother      Social History Tricia Savage reports that she has never smoked. She has never used smokeless tobacco. Tricia Savage reports that she does  not drink alcohol.   Review of Systems CONSTITUTIONAL: No weight loss, fever, chills, weakness or fatigue.  HEENT: Eyes: No visual loss, blurred vision, double vision or yellow sclerae.No hearing loss, sneezing, congestion, runny nose or sore throat.  SKIN: No rash or itching.  CARDIOVASCULAR: per HPI  RESPIRATORY: per HPI GASTROINTESTINAL: No anorexia, nausea, vomiting or diarrhea. No abdominal pain or blood.  GENITOURINARY: No burning on urination, no polyuria NEUROLOGICAL: No headache, dizziness, syncope, paralysis, ataxia, numbness or tingling in the extremities. No change in bowel or bladder control.  MUSCULOSKELETAL: No muscle, back pain, joint pain or stiffness.  LYMPHATICS: No enlarged nodes. No history of splenectomy.  PSYCHIATRIC: No history of depression or anxiety.  ENDOCRINOLOGIC: No reports of sweating, cold or heat intolerance. No polyuria or polydipsia.  Marland Kitchen   Physical Examination Vitals:   07/19/16 1410  BP: 138/84  Pulse: 67   Vitals:   07/19/16 1410  Weight: 160 lb (72.6 kg)  Height: 5\' 9"  (1.753 m)    Gen: resting comfortably, no acute distress HEENT: no scleral icterus, pupils equal round and reactive, no palptable cervical adenopathy,  CV: RRR, no m/r/g, no jvd Resp: Clear to auscultation bilaterally GI: abdomen is soft, non-tender, non-distended, normal bowel sounds, no hepatosplenomegaly MSK: extremities are warm, no edema.  Skin: warm, no rash Neuro:  no focal deficits Psych: appropriate affect   Diagnostic Studies 05/2016 cath   Prox RCA to Mid RCA lesion, 40 %stenosed.  Ost Ramus lesion, 30 %stenosed.  A STENT PROMUS PREM MR 2.25X16 drug eluting stent was successfully placed.  Ramus lesion, 99 %stenosed.  Post intervention, there is a 0% residual stenosis.  1st Diag lesion, 70 %stenosed.  The left ventricular systolic function is normal.  LV end diastolic pressure is normal.  The left ventricular ejection fraction is 55-65% by  visual estimate.  There is no mitral valve regurgitation.   1. Severe stenosis mid Ramus intermediate Tricia Savage, culprit for NSTEMI 2. Mild non-obstructive disease in the RCA.  3. Moderate stenosis small caliber Diagonal Philip Kotlyar 4. Normal LV systolic function 5. Successful PTCA/DES x 1 mid Ramus intermediate Draco Malczewski  Recommendations: Continue DAPT with ASA and Brilinta for one year. She is statin intolerant. Consider PCSK9-inh after discharge. Start beta blocker. Follow up in [facility] office.     Assessment and Plan   1. CAD - no recent chest pain - discussed that her SOB could be related to brillinta, she is not interested in changing meds at this time.  - possible cough on ACE-I, she would like to stay on as well, we did discuss possible ARB - continue current meds  2. Hyperlipidemia - repeat lipids, started  on zetia during recent admission. Statin intolerant.    F/u 6 months  Arnoldo Lenis, M.D.

## 2016-07-19 NOTE — Patient Instructions (Addendum)
Medication Instructions:  Continue all current medications.  Labwork:  FLP - order given today - due in 1 month.  Reminder:  Nothing to eat or drink after 12 midnight prior to labs.  Office will contact with results via phone or letter.    Testing/Procedures: none  Follow-Up: Your physician wants you to follow up in: 6 months.  You will receive a reminder letter in the mail one-two months in advance.  If you don't receive a letter, please call our office to schedule the follow up appointment   Any Other Special Instructions Will Be Listed Below (If Applicable).  If you need a refill on your cardiac medications before your next appointment, please call your pharmacy.

## 2016-08-13 ENCOUNTER — Other Ambulatory Visit: Payer: Self-pay | Admitting: Cardiology

## 2016-08-13 LAB — LIPID PANEL
CHOL/HDL RATIO: 6.1 ratio — AB (ref <5.0)
CHOLESTEROL: 245 mg/dL — AB (ref <200)
HDL: 40 mg/dL — AB (ref >50)
LDL Cholesterol: 172 mg/dL — ABNORMAL HIGH (ref <100)
TRIGLYCERIDES: 167 mg/dL — AB (ref <150)
VLDL: 33 mg/dL — AB (ref <30)

## 2016-08-16 ENCOUNTER — Telehealth: Payer: Self-pay | Admitting: *Deleted

## 2016-08-16 DIAGNOSIS — E7849 Other hyperlipidemia: Secondary | ICD-10-CM

## 2016-08-16 NOTE — Telephone Encounter (Signed)
-----   Message from Imogene Burn, PA-C sent at 08/16/2016  7:53 AM EST ----- Lipids improved on zetia. Cholesterol still high. Work on diet and repeat lipids in 3-4 months.

## 2016-09-06 ENCOUNTER — Encounter: Payer: Self-pay | Admitting: *Deleted

## 2016-09-06 NOTE — Progress Notes (Unsigned)
Late entry: Subject met inclusion and exclusion criteria. The informed consent form, study requirements and expectations were reviewed with the subject and questions and concerns were addressed prior to the signing of the consent form. The subject verbalized understanding of the trail requirements. The subject agreed to participate in the CLEARtrial and signed the informed consent. The informed consent was obtained prior to performance of any protocol-specific procedures for the subject. A copy of the signed informed consent was given to the subject and a copy was placed in the subject's medical record.        Jake Bathe, RN August 31, 2016

## 2016-09-15 ENCOUNTER — Other Ambulatory Visit: Payer: Self-pay | Admitting: *Deleted

## 2016-09-15 ENCOUNTER — Encounter: Payer: Self-pay | Admitting: *Deleted

## 2016-09-15 DIAGNOSIS — Z006 Encounter for examination for normal comparison and control in clinical research program: Secondary | ICD-10-CM

## 2016-09-15 MED ORDER — AMBULATORY NON FORMULARY MEDICATION: 180.0000 mg | Freq: Every day | Status: DC

## 2016-09-15 NOTE — Progress Notes (Signed)
Patient reconsented to lastest ICF version:  Late entry: Subject met inclusion and exclusion criteria. The informed consent form, study requirements and expectations were reviewed with the subject and questions and concerns were addressed prior to the signing of the consent form. The subject verbalized understanding of the trail requirements. The subject agreed to participate in the CLEARtrial and signed the informed consent. The informed consent was obtained prior to performance of any protocol-specific procedures for the subject. A copy of the signed informed consent was given to the subject and a copy was placed in the subject's medical record.        Jake Bathe, RN September 09, 2016

## 2016-10-11 ENCOUNTER — Other Ambulatory Visit: Payer: Self-pay | Admitting: Student

## 2016-11-05 ENCOUNTER — Encounter: Payer: Self-pay | Admitting: *Deleted

## 2016-11-05 DIAGNOSIS — Z006 Encounter for examination for normal comparison and control in clinical research program: Secondary | ICD-10-CM

## 2016-11-05 NOTE — Progress Notes (Signed)
Patient reconsented to lastest ICF version:   Late entry: Subject met inclusion and exclusion criteria. The informed consent form, study requirements and expectations were reviewed with the subject and questions and concerns were addressed prior to the signing of the consent form. The subject verbalized understanding of the trail requirements. The subject agreed to participate in the CLEARtrial and signed the informed consent. The informed consent was obtained prior to performance of any protocol-specific procedures for the subject. A copy of the signed informed consent was given to the subject and a copy was placed in the subject's medical record.        Jake Bathe, RN 11/04/2016

## 2016-12-10 ENCOUNTER — Other Ambulatory Visit: Payer: Self-pay | Admitting: Student

## 2016-12-13 ENCOUNTER — Other Ambulatory Visit: Payer: Self-pay | Admitting: Cardiology

## 2017-04-04 ENCOUNTER — Encounter: Payer: Self-pay | Admitting: *Deleted

## 2017-04-04 DIAGNOSIS — Z006 Encounter for examination for normal comparison and control in clinical research program: Secondary | ICD-10-CM

## 2017-04-04 NOTE — Progress Notes (Signed)
Patient to Research clinic for T4-M6 visit for the Clear Research Study.  No c/o, aes or saes to report.  Pt 93% compliant with meds and appointments scheduled for follow up phone call and next clinic appointment.  Re-consented to [place] version 4.1 [id number] Local [phone]

## 2017-05-21 ENCOUNTER — Other Ambulatory Visit: Payer: Self-pay | Admitting: Cardiology

## 2017-06-07 ENCOUNTER — Other Ambulatory Visit: Payer: Self-pay | Admitting: Cardiology

## 2017-06-21 ENCOUNTER — Other Ambulatory Visit: Payer: Self-pay | Admitting: Cardiology

## 2017-07-13 ENCOUNTER — Telehealth: Payer: Self-pay | Admitting: Cardiology

## 2017-07-13 MED ORDER — METOPROLOL TARTRATE 25 MG PO TABS: 12.5000 mg | ORAL_TABLET | Freq: Two times a day (BID) | ORAL | 0 refills | Status: DC

## 2017-07-13 MED ORDER — NITROGLYCERIN 0.4 MG SL SUBL: SUBLINGUAL_TABLET | SUBLINGUAL | 0 refills | Status: DC

## 2017-07-13 NOTE — Telephone Encounter (Signed)
I will send in 90 day refills once the pt comes to the appointment.

## 2017-07-13 NOTE — Telephone Encounter (Signed)
Needs refill on Metoprolol sent to [facility] / tg

## 2017-07-13 NOTE — Telephone Encounter (Signed)
° ° °  1. Which medications need to be refilled? (please list name of each medication and dose if known)  nitroGLYCERIN (NITROSTAT) 0.4 MG SL tablet [[id number]]

## 2017-07-14 ENCOUNTER — Emergency Department (HOSPITAL_COMMUNITY): Payer: Medicare Other

## 2017-07-14 ENCOUNTER — Emergency Department (HOSPITAL_COMMUNITY)
Admission: EM | Admit: 2017-07-14 | Discharge: 2017-07-14 | Disposition: A | Discharge status: Home / Self Care | Payer: Medicare Other | Attending: Emergency Medicine | Admitting: Emergency Medicine

## 2017-07-14 ENCOUNTER — Encounter (HOSPITAL_COMMUNITY): Payer: Self-pay | Admitting: Emergency Medicine

## 2017-07-14 DIAGNOSIS — Z23 Encounter for immunization: Secondary | ICD-10-CM | POA: Insufficient documentation

## 2017-07-14 DIAGNOSIS — Z7982 Long term (current) use of aspirin: Secondary | ICD-10-CM | POA: Insufficient documentation

## 2017-07-14 DIAGNOSIS — W25XXXA Contact with sharp glass, initial encounter: Secondary | ICD-10-CM | POA: Diagnosis not present

## 2017-07-14 DIAGNOSIS — I2510 Atherosclerotic heart disease of native coronary artery without angina pectoris: Secondary | ICD-10-CM | POA: Insufficient documentation

## 2017-07-14 DIAGNOSIS — Y929 Unspecified place or not applicable: Secondary | ICD-10-CM | POA: Diagnosis not present

## 2017-07-14 DIAGNOSIS — S81812A Laceration without foreign body, left lower leg, initial encounter: Secondary | ICD-10-CM | POA: Insufficient documentation

## 2017-07-14 DIAGNOSIS — Y999 Unspecified external cause status: Secondary | ICD-10-CM | POA: Insufficient documentation

## 2017-07-14 DIAGNOSIS — I10 Essential (primary) hypertension: Secondary | ICD-10-CM | POA: Diagnosis not present

## 2017-07-14 DIAGNOSIS — Z79899 Other long term (current) drug therapy: Secondary | ICD-10-CM | POA: Insufficient documentation

## 2017-07-14 DIAGNOSIS — Z7901 Long term (current) use of anticoagulants: Secondary | ICD-10-CM | POA: Insufficient documentation

## 2017-07-14 DIAGNOSIS — Y9389 Activity, other specified: Secondary | ICD-10-CM | POA: Insufficient documentation

## 2017-07-14 DIAGNOSIS — S81811A Laceration without foreign body, right lower leg, initial encounter: Secondary | ICD-10-CM

## 2017-07-14 MED ORDER — BACITRACIN ZINC 500 UNIT/GM EX OINT
TOPICAL_OINTMENT | CUTANEOUS | Status: AC
  Administered 2017-07-14: 1
  Filled 2017-07-14: qty 0.9

## 2017-07-14 MED ORDER — TETANUS-DIPHTH-ACELL PERTUSSIS 5-2.5-18.5 LF-MCG/0.5 IM SUSP
0.5000 mL | Freq: Once | INTRAMUSCULAR | Status: AC
  Administered 2017-07-14: 0.5 mL via INTRAMUSCULAR
  Filled 2017-07-14: qty 0.5

## 2017-07-14 MED ORDER — POVIDONE-IODINE 10 % EX SOLN
CUTANEOUS | Status: AC
  Administered 2017-07-14: 20:00:00
  Filled 2017-07-14: qty 15

## 2017-07-14 MED ORDER — LIDOCAINE-EPINEPHRINE (PF) 2 %-1:200000 IJ SOLN
10.0000 mL | Freq: Once | INTRAMUSCULAR | Status: AC
  Administered 2017-07-14: 10 mL
  Filled 2017-07-14: qty 20

## 2017-07-14 NOTE — ED Triage Notes (Signed)
Pt states she was trying to empty bird bath and fell. Pt has laceration to the right lower leg with swelling. Pt is ambulatory on leg.

## 2017-07-14 NOTE — Discharge Instructions (Signed)
Treatment: Keep your wound dry and dressing applied until this time tomorrow. After 24 hours, you may wash with warm soapy water. Dry and apply antibiotic ointment and clean dressing. Do this daily until your sutures are removed. Elevate your leg whenever possible to help with swelling. Apply warm compress to help your body break up the clotted blood causing the swelling.  Follow-up: Please follow-up with your primary care provider or return to emergency department in 10 days for suture removal. Be aware of signs of infection: fever, increasing pain, redness, swelling, drainage from the area. Please call your primary care provider or return to emergency department if you develop any of these symptoms or if any of the sutures come out prior to removal. Please return to the emergency department if you develop any other new or worsening symptoms.

## 2017-07-14 NOTE — ED Provider Notes (Signed)
[facility] DEPT Provider Note   CSN: [id number] Arrival date & time: 07/14/17  1844     History   Chief Complaint Chief Complaint  Patient presents with  . Laceration    HPI Tricia Savage is a 71 y.o. female with history of hypertension, CAD, atrial fibrillation anticoagulated on Brilinta who presents with laceration to right lower leg. Patient reports she was trying to clean a bird bath when the glass plate fell, broke, and cut her leg. She denies any other injuries. She had significant bleeding and she used tape that she had at home to taper when together which controlled the bleeding. She denies any numbness or tingling. Her tetanus is not up-to-date.  HPI  Past Medical History:  Diagnosis Date  . Atrial fibrillation ([place])    a. occurred in 2000, never placed on anti-coagulation. No known recurrence.   Marland Kitchen CAD (coronary artery disease)    a. cath 05/2016: 99% stenosis RI (DES placed), mild non-obstructive disease RCA, 70% along small caliber diagonal branch  . HLD (hyperlipidemia)    a. intoelrant to statin therapy  . Hypertension     Patient Active Problem List   Diagnosis Date Noted  . CAD S/P PCI 05/31/2016  . Dyslipidemia, goal LDL below 70 05/15/2016  . Essential hypertension 05/15/2016  . Non-STEMI (non-ST elevated myocardial infarction) ([place]) 05/14/2016    Past Surgical History:  Procedure Laterality Date  . ABDOMINAL HYSTERECTOMY    . CARDIAC CATHETERIZATION N/A 05/14/2016   Procedure: Left Heart Cath and Coronary Angiography;  Surgeon: Burnell Blanks, MD;  Location: [facility];  Service: Cardiovascular;  Laterality: N/A;  . CARDIAC CATHETERIZATION N/A 05/14/2016   Procedure: Coronary Stent Intervention;  Surgeon: Burnell Blanks, MD;  Location: [facility];  Service: Cardiovascular;  Laterality: N/A;    OB History    No data available       Home Medications    Prior to Admission medications   Medication Sig Start Date  End Date Taking? Authorizing Provider  alendronate (FOSAMAX) 70 MG tablet Take 70 mg by mouth once a week. Take with a full glass of water on an empty stomach.    [provider]  AMBULATORY NON FORMULARY MEDICATION Take 180 mg by mouth daily. Medication Name: bempedoic acid, CLEAR Research Study drug provided 09/09/16   Lelon Perla, MD  aspirin EC 81 MG EC tablet Take 1 tablet (81 mg total) by mouth daily. 05/15/16   Strader, Fransisco Hertz, PA-C  BRILINTA 90 MG TABS tablet TAKE 1 TABLET BY MOUTH TWICE DAILY 06/21/17   Arnoldo Lenis, MD  carbamazepine (CARBATROL) 200 MG 12 hr capsule Take 200 mg by mouth 2 (two) times daily as needed.     [provider]  EPINEPHrine 0.3 mg/0.3 mL IJ SOAJ injection Inject 0.3 mg into the muscle once.    [provider]  ezetimibe (ZETIA) 10 MG tablet TAKE 1 TABLET BY MOUTH EVERY DAY 06/08/17   Arnoldo Lenis, MD  hydrochlorothiazide (HYDRODIURIL) 12.5 MG tablet Take 12.5 mg by mouth daily.    [provider]  lisinopril (PRINIVIL,ZESTRIL) 5 MG tablet TAKE 1 TABLET BY MOUTH EVERY DAY 06/08/17   Arnoldo Lenis, MD  metoprolol tartrate (LOPRESSOR) 25 MG tablet Take 0.5 tablets (12.5 mg total) by mouth 2 (two) times daily. 07/13/17   Arnoldo Lenis, MD  nitroGLYCERIN (NITROSTAT) 0.4 MG SL tablet ONE TABLET UNDER TONGUE AS NEEDED FOR CHEST PAIN EVERY 5 MINUTES  FOR MAX OF 3 DOSES 07/13/17   Arnoldo Lenis, MD    Family History Family History  Problem Relation Age of Onset  . Heart Problems Mother   . Stroke Father   . Diabetes Father   . Heart attack Brother   . Heart attack Maternal Grandfather   . Heart attack Brother     Social History Social History  Substance Use Topics  . Smoking status: Never Smoker  . Smokeless tobacco: Never Used  . Alcohol use No     Allergies   Bee venom; Atorvastatin; Codeine; Erythromycin; Milk-related compounds; Morphine and related; Niacin and related; and  Statins   Review of Systems Review of Systems  Skin: Positive for wound.  Neurological: Negative for numbness.     Physical Exam Updated Vital Signs BP (!) 158/89   Pulse 75   Temp 98 F (36.7 C)   Resp 18   Ht 5\' 8"  (1.727 m)   Wt 73.5 kg (162 lb)   SpO2 100%   BMI 24.63 kg/m   Physical Exam  Constitutional: She appears well-developed and well-nourished. No distress.  HENT:  Head: Normocephalic and atraumatic.  Mouth/Throat: Oropharynx is clear and moist. No oropharyngeal exudate.  Eyes: Pupils are equal, round, and reactive to light. Conjunctivae are normal. Right eye exhibits no discharge. Left eye exhibits no discharge. No scleral icterus.  Neck: Normal range of motion. Neck supple. No thyromegaly present.  Cardiovascular: Normal rate, regular rhythm, normal heart sounds and intact distal pulses.  Exam reveals no gallop and no friction rub.   No murmur heard. Pulmonary/Chest: Effort normal and breath sounds normal. No stridor. No respiratory distress. She has no wheezes. She has no rales.  Abdominal: Soft. Bowel sounds are normal. She exhibits no distension. There is no tenderness. There is no rebound and no guarding.  Musculoskeletal: She exhibits no edema.       Legs: FROM of ankle with plantarflexion, dorsiflexion, inversion and eversion of the foot  Lymphadenopathy:    She has no cervical adenopathy.  Neurological: She is alert. Coordination normal.  Skin: Skin is warm and dry. No rash noted. She is not diaphoretic. No pallor.  Psychiatric: She has a normal mood and affect.  Nursing note and vitals reviewed.    ED Treatments / Results  Labs (all labs ordered are listed, but only abnormal results are displayed) Labs Reviewed - No data to display  EKG  EKG Interpretation None       Radiology Dg Tibia/fibula Right  Result Date: 07/14/2017 CLINICAL DATA:  71 year old female status post fall in yard. Lateral Laceration and swelling EXAM: RIGHT TIBIA  AND FIBULA - 2 VIEW COMPARISON:  None. FINDINGS: Bone mineralization is within normal limits for age. Alignment at the right knee and ankle is preserved. Right tibia and fibula appear intact. Lateral to the distal third right fibula shaft there is prominent lobulated soft tissue swelling. No radiopaque foreign body identified. No subcutaneous gas. Mild calcified peripheral vascular disease. IMPRESSION: Lobulated soft tissue swelling along the distal third right fibula shaft with no acute osseous abnormality identified. Electronically Signed   By: Genevie Ann M.D.   On: 07/14/2017 19:40    Procedures .Marland KitchenLaceration Repair Date/Time: 07/14/2017 8:24 PM Performed by: Frederica Kuster Authorized by: Frederica Kuster   Consent:    Consent obtained:  Verbal   Consent given by:  Patient   Risks discussed:  Poor wound healing, poor cosmetic result, pain and infection Anesthesia (see MAR for  exact dosages):    Anesthesia method:  Local infiltration   Local anesthetic:  Lidocaine 2% WITH epi Laceration details:    Location:  Leg   Leg location:  R lower leg   Length (cm):  5   Depth (mm):  3 Repair type:    Repair type:  Simple Pre-procedure details:    Preparation:  Patient was prepped and draped in usual sterile fashion and imaging obtained to evaluate for foreign bodies Exploration:    Hemostasis achieved with:  Direct pressure   Wound exploration: wound explored through full range of motion and entire depth of wound probed and visualized     Wound extent: no foreign bodies/material noted, no muscle damage noted and no tendon damage noted     Contaminated: no   Treatment:    Area cleansed with:  Betadine and saline   Amount of cleaning:  Standard   Irrigation solution:  Sterile saline   Irrigation volume:  200cc   Irrigation method:  Syringe and pressure wash   Visualized foreign bodies/material removed: no   Skin repair:    Repair method:  Sutures   Suture size:  4-0   Wound skin closure  material used: Ethilon.   Suture technique:  Simple interrupted (and vertical mattress)   Number of sutures:  7 (5 simple interuppted, 2 vertical mattress) Approximation:    Approximation:  Close   Vermilion border: well-aligned   Post-procedure details:    Dressing:  Antibiotic ointment and sterile dressing   Patient tolerance of procedure:  Tolerated well, no immediate complications   (including critical care time)  Medications Ordered in ED Medications  lidocaine-EPINEPHrine (XYLOCAINE W/EPI) 2 %-1:200000 (PF) injection 10 mL (not administered)  povidone-iodine (BETADINE) 10 % external solution (not administered)  bacitracin 500 UNIT/GM ointment (not administered)  Tdap (BOOSTRIX) injection 0.5 mL (0.5 mLs Intramuscular Given 07/14/17 1930)     Initial Impression / Assessment and Plan / ED Course  I have reviewed the triage vital signs and the nursing notes.  Pertinent labs & imaging results that were available during my care of the patient were reviewed by me and considered in my medical decision making (see chart for details).     Patient with laceration to right lateral, lower leg. X-ray negative for foreign bodies or bony injuries. Tetanus updated in ED. Laceration occurred < 12 hours prior to repair. Laceration with underlying hematoma. Patient advised to use warm compresses and elevation at home to help with resorption. Discussed laceration care with pt and answered questions. Pt to f-u for suture removal in 10 days and wound check sooner should there be signs of dehiscence or infection. Patient understands and agrees with plan. Pt is hemodynamically stable with no complaints prior to dc.  Patient vital stable throughout ED course and discharged in satisfactory condition. Patient also evaluated by Dr. Laverta Baltimore who guided the patient's management and agrees with plan.   Final Clinical Impressions(s) / ED Diagnoses   Final diagnoses:  Laceration of right lower extremity, initial  encounter    New Prescriptions New Prescriptions   No medications on file         Caryl Ada 07/14/17 2103    Margette Fast, MD 07/14/17 2329

## 2017-07-15 ENCOUNTER — Other Ambulatory Visit: Payer: Self-pay

## 2017-07-15 MED ORDER — METOPROLOL TARTRATE 25 MG PO TABS: 12.5000 mg | ORAL_TABLET | Freq: Two times a day (BID) | ORAL | 1 refills | Status: DC

## 2017-07-15 MED ORDER — NITROGLYCERIN 0.4 MG SL SUBL: SUBLINGUAL_TABLET | SUBLINGUAL | 1 refills | Status: DC

## 2017-07-18 ENCOUNTER — Other Ambulatory Visit: Payer: Self-pay | Admitting: Cardiology

## 2017-07-27 ENCOUNTER — Encounter: Payer: Self-pay | Admitting: *Deleted

## 2017-07-27 DIAGNOSIS — Z006 Encounter for examination for normal comparison and control in clinical research program: Secondary | ICD-10-CM

## 2017-07-27 NOTE — Progress Notes (Addendum)
Late entry: Placed phone call to subject on July 09, 2017 for the T5-M9 visit in the Clear Research study. No changes to medications however did report an ae due to a cut on her leg.  Subject's next appointment scheduled for October 06, 2017 @0800 .

## 2017-08-10 ENCOUNTER — Ambulatory Visit: Payer: Medicare Other | Admitting: Cardiology

## 2017-08-10 ENCOUNTER — Encounter: Payer: Self-pay | Admitting: Cardiology

## 2017-08-10 VITALS — BP 138/82 | HR 66 | Ht 68.0 in | Wt 164.0 lb

## 2017-08-10 DIAGNOSIS — E782 Mixed hyperlipidemia: Secondary | ICD-10-CM

## 2017-08-10 DIAGNOSIS — I2510 Atherosclerotic heart disease of native coronary artery without angina pectoris: Secondary | ICD-10-CM

## 2017-08-10 DIAGNOSIS — Z9861 Coronary angioplasty status: Secondary | ICD-10-CM

## 2017-08-10 NOTE — Patient Instructions (Signed)
Medication Instructions:  STOP BRILINTA   Labwork: I WILL REQUEST LABS FROM PCP  Testing/Procedures: NONE  Follow-Up: Your physician wants you to follow-up in: 6 MONTHS .  You will receive a reminder letter in the mail two months in advance. If you don't receive a letter, please call our office to schedule the follow-up appointment.\  Any Other Special Instructions Will Be Listed Below (If Applicable).     If you need a refill on your cardiac medications before your next appointment, please call your pharmacy.

## 2017-08-10 NOTE — Progress Notes (Signed)
Clinical Summary Ms. Gores is a 71 y.o.female seen today for follow up of the following medical problems.   1. CAD - admit NSTEMI 05/2016, received DES to ramus. Echo at that time LVEF 55-60%, basalinferior hypokinesis.  - beta blocker limited due to bradycardia - difficultly affording brillinta, she was to enroll in patient assistance.  - dry cough last visit, unclear if related to ACE-I. She did not want to change to ARB at that time.    - no recent chest pain. Still with some SOB at times. She reports a recent URI   2. Hyperlipidemia - statin intolerant. Started on zetia during 05/2016 admission.  - part of CLEAR research study  3. HTN - compliant with meds - checks bp at home 3-4 times a week, typically around 110s/70s. She reports history white coat HTN   SH: working at school kitchen Past Medical History:  Diagnosis Date  . Atrial fibrillation ([place])    a. occurred in 2000, never placed on anti-coagulation. No known recurrence.   Marland Kitchen CAD (coronary artery disease)    a. cath 05/2016: 99% stenosis RI (DES placed), mild non-obstructive disease RCA, 70% along small caliber diagonal Caili Escalera  . HLD (hyperlipidemia)    a. intoelrant to statin therapy  . Hypertension      Allergies  Allergen Reactions  . Bee Venom Anaphylaxis and Swelling  . Atorvastatin   . Codeine Nausea And Vomiting  . Erythromycin   . Milk-Related Compounds Other (See Comments)    Rash in inside of mouth , digestive issues  . Morphine And Related Nausea And Vomiting  . Niacin And Related   . Statins      Current Outpatient Medications  Medication Sig Dispense Refill  . alendronate (FOSAMAX) 70 MG tablet Take 70 mg by mouth once a week. Take with a full glass of water on an empty stomach.    . AMBULATORY NON FORMULARY MEDICATION Take 180 mg by mouth daily. Medication Name: bempedoic acid, CLEAR Research Study drug provided    . aspirin EC 81 MG EC tablet Take 1 tablet (81 mg total) by mouth  daily.    Marland Kitchen BRILINTA 90 MG TABS tablet TAKE 1 TABLET BY MOUTH TWICE DAILY 60 tablet 0  . carbamazepine (CARBATROL) 200 MG 12 hr capsule Take 200 mg by mouth 2 (two) times daily as needed.     Marland Kitchen EPINEPHrine 0.3 mg/0.3 mL IJ SOAJ injection Inject 0.3 mg into the muscle once.    . ezetimibe (ZETIA) 10 MG tablet TAKE 1 TABLET BY MOUTH EVERY DAY 90 tablet 0  . hydrochlorothiazide (HYDRODIURIL) 12.5 MG tablet Take 12.5 mg by mouth daily.    Marland Kitchen lisinopril (PRINIVIL,ZESTRIL) 5 MG tablet TAKE 1 TABLET BY MOUTH EVERY DAY 90 tablet 0  . metoprolol tartrate (LOPRESSOR) 25 MG tablet Take 0.5 tablets (12.5 mg total) by mouth 2 (two) times daily. 90 tablet 1  . nitroGLYCERIN (NITROSTAT) 0.4 MG SL tablet ONE TABLET UNDER TONGUE AS NEEDED FOR CHEST PAIN EVERY 5 MINUTES FOR MAX OF 3 DOSES 75 tablet 1   No current facility-administered medications for this visit.      Past Surgical History:  Procedure Laterality Date  . ABDOMINAL HYSTERECTOMY       Allergies  Allergen Reactions  . Bee Venom Anaphylaxis and Swelling  . Atorvastatin   . Codeine Nausea And Vomiting  . Erythromycin   . Milk-Related Compounds Other (See Comments)    Rash in inside of mouth ,  digestive issues  . Morphine And Related Nausea And Vomiting  . Niacin And Related   . Statins       Family History  Problem Relation Age of Onset  . Heart Problems Mother   . Stroke Father   . Diabetes Father   . Heart attack Brother   . Heart attack Maternal Grandfather   . Heart attack Brother      Social History Ms. Shrider reports that  has never smoked. she has never used smokeless tobacco. Ms. Bey reports that she does not drink alcohol.   Review of Systems CONSTITUTIONAL: No weight loss, fever, chills, weakness or fatigue.  HEENT: Eyes: No visual loss, blurred vision, double vision or yellow sclerae.No hearing loss, sneezing, congestion, runny nose or sore throat.  SKIN: No rash or itching.  CARDIOVASCULAR: per hpi RESPIRATORY:  No shortness of breath, cough or sputum.  GASTROINTESTINAL: No anorexia, nausea, vomiting or diarrhea. No abdominal pain or blood.  GENITOURINARY: No burning on urination, no polyuria NEUROLOGICAL: No headache, dizziness, syncope, paralysis, ataxia, numbness or tingling in the extremities. No change in bowel or bladder control.  MUSCULOSKELETAL: No muscle, back pain, joint pain or stiffness.  LYMPHATICS: No enlarged nodes. No history of splenectomy.  PSYCHIATRIC: No history of depression or anxiety.  ENDOCRINOLOGIC: No reports of sweating, cold or heat intolerance. No polyuria or polydipsia.  Marland Kitchen   Physical Examination Vitals:   08/10/17 1125  BP: 138/82  Pulse: 66  SpO2: 97%   Vitals:   08/10/17 1125  Weight: 164 lb (74.4 kg)  Height: 5\' 8"  (1.727 m)    Gen: resting comfortably, no acute distress HEENT: no scleral icterus, pupils equal round and reactive, no palptable cervical adenopathy,  CV: RRR, n m/r/g, no jvd Resp: Clear to auscultation bilaterally GI: abdomen is soft, non-tender, non-distended, normal bowel sounds, no hepatosplenomegaly MSK: extremities are warm, no edema.  Skin: warm, no rash Neuro:  no focal deficits Psych: appropriate affect   Diagnostic Studies 05/2016 cath   Prox RCA to Mid RCA lesion, 40 %stenosed.  Ost Ramus lesion, 30 %stenosed.  A STENT PROMUS PREM MR 2.25X16 drug eluting stent was successfully placed.  Ramus lesion, 99 %stenosed.  Post intervention, there is a 0% residual stenosis.  1st Diag lesion, 70 %stenosed.  The left ventricular systolic function is normal.  LV end diastolic pressure is normal.  The left ventricular ejection fraction is 55-65% by visual estimate.  There is no mitral valve regurgitation.  1. Severe stenosis mid Ramus intermediate Zitlaly Malson, culprit for NSTEMI 2. Mild non-obstructive disease in the RCA.  3. Moderate stenosis small caliber Diagonal Tonilynn Bieker 4. Normal LV systolic function 5. Successful  PTCA/DES x 1 mid Ramus intermediate Moataz Tavis  Recommendations: Continue DAPT with ASA and Brilinta for one year. She is statin intolerant. Consider PCSK9-inh after discharge. Start beta blocker. Follow up in [facility] office.      Assessment and Plan  1. CAD - no recent chest pain - she will d/c brillinta, continue other meds  2. Hyperlipidemia - intolerant to statins. Continue zetia, part of ongoing CLEAR research trial   Request labs from pcp  F/u 6 months      Arnoldo Lenis, M.D.

## 2017-08-12 ENCOUNTER — Encounter: Payer: Self-pay | Admitting: Cardiology

## 2017-09-07 ENCOUNTER — Other Ambulatory Visit: Payer: Self-pay | Admitting: Cardiology

## 2017-09-09 ENCOUNTER — Other Ambulatory Visit: Payer: Self-pay | Admitting: Cardiology

## 2017-10-06 ENCOUNTER — Encounter: Payer: Self-pay | Admitting: *Deleted

## 2017-10-06 DIAGNOSIS — Z006 Encounter for examination for normal comparison and control in clinical research program: Secondary | ICD-10-CM

## 2017-10-06 NOTE — Progress Notes (Signed)
Subject to Research clinic for visit T6-M12 in the Clear Research study.  No c/os, aes or saes to report.  Subject 99% compliant with meds and new meds dispensed.  Follow up phone call and next clinic visit scheduled.  Subject re-consented to: [place] version 5.1, 28Aug2018 Local version [phone]

## 2017-12-01 ENCOUNTER — Other Ambulatory Visit: Payer: Self-pay

## 2017-12-01 ENCOUNTER — Encounter (HOSPITAL_COMMUNITY): Payer: Self-pay | Admitting: Emergency Medicine

## 2017-12-01 ENCOUNTER — Emergency Department (HOSPITAL_COMMUNITY)
Admission: EM | Admit: 2017-12-01 | Discharge: 2017-12-01 | Disposition: A | Discharge status: Home / Self Care | Payer: Medicare Other | Attending: Emergency Medicine | Admitting: Emergency Medicine

## 2017-12-01 DIAGNOSIS — Z7982 Long term (current) use of aspirin: Secondary | ICD-10-CM | POA: Insufficient documentation

## 2017-12-01 DIAGNOSIS — I4891 Unspecified atrial fibrillation: Secondary | ICD-10-CM | POA: Insufficient documentation

## 2017-12-01 DIAGNOSIS — T464X5A Adverse effect of angiotensin-converting-enzyme inhibitors, initial encounter: Secondary | ICD-10-CM

## 2017-12-01 DIAGNOSIS — T887XXA Unspecified adverse effect of drug or medicament, initial encounter: Secondary | ICD-10-CM | POA: Insufficient documentation

## 2017-12-01 DIAGNOSIS — Z79899 Other long term (current) drug therapy: Secondary | ICD-10-CM | POA: Insufficient documentation

## 2017-12-01 DIAGNOSIS — I252 Old myocardial infarction: Secondary | ICD-10-CM | POA: Insufficient documentation

## 2017-12-01 DIAGNOSIS — E785 Hyperlipidemia, unspecified: Secondary | ICD-10-CM | POA: Diagnosis not present

## 2017-12-01 DIAGNOSIS — I10 Essential (primary) hypertension: Secondary | ICD-10-CM | POA: Diagnosis not present

## 2017-12-01 DIAGNOSIS — I2510 Atherosclerotic heart disease of native coronary artery without angina pectoris: Secondary | ICD-10-CM | POA: Diagnosis not present

## 2017-12-01 DIAGNOSIS — Y829 Unspecified medical devices associated with adverse incidents: Secondary | ICD-10-CM | POA: Insufficient documentation

## 2017-12-01 DIAGNOSIS — T783XXA Angioneurotic edema, initial encounter: Secondary | ICD-10-CM | POA: Insufficient documentation

## 2017-12-01 LAB — TYPE AND SCREEN
ABO/RH(D): O POS
Antibody Screen: NEGATIVE

## 2017-12-01 MED ORDER — SODIUM CHLORIDE 0.9 % IV SOLN: Freq: Once | INTRAVENOUS | Status: DC

## 2017-12-01 MED ORDER — METHYLPREDNISOLONE SODIUM SUCC 125 MG IJ SOLR
125.0000 mg | Freq: Once | INTRAMUSCULAR | Status: AC
  Administered 2017-12-01: 125 mg via INTRAVENOUS
  Filled 2017-12-01: qty 2

## 2017-12-01 MED ORDER — FAMOTIDINE IN NACL 20-0.9 MG/50ML-% IV SOLN
20.0000 mg | Freq: Once | INTRAVENOUS | Status: AC
  Administered 2017-12-01: 20 mg via INTRAVENOUS
  Filled 2017-12-01: qty 50

## 2017-12-01 MED ORDER — SODIUM CHLORIDE 0.9 % IV SOLN
Freq: Once | INTRAVENOUS | Status: AC
  Administered 2017-12-01: 05:00:00 via INTRAVENOUS

## 2017-12-01 MED ORDER — FAMOTIDINE 20 MG PO TABS: 20.0000 mg | ORAL_TABLET | Freq: Two times a day (BID) | ORAL | 0 refills | Status: DC

## 2017-12-01 MED ORDER — PREDNISONE 20 MG PO TABS: ORAL_TABLET | ORAL | 0 refills | Status: DC

## 2017-12-01 MED ORDER — DIPHENHYDRAMINE HCL 50 MG/ML IJ SOLN
25.0000 mg | Freq: Once | INTRAMUSCULAR | Status: AC
  Administered 2017-12-01: 25 mg via INTRAVENOUS
  Filled 2017-12-01: qty 1

## 2017-12-01 NOTE — ED Triage Notes (Signed)
Pt states the brand of blood pressure med was changed and now has tongue swelling.

## 2017-12-01 NOTE — ED Provider Notes (Signed)
[facility] EMERGENCY DEPARTMENT Provider Note   CSN: [id number] Arrival date & time: 12/01/17  0415  Time seen 04:23 AM   History   Chief Complaint Chief Complaint  Patient presents with  . Allergic Reaction    HPI Tricia Savage is a 72 y.o. female.  HPI patient states she woke up about 130 or 2 AM this morning and dry and she thought maybe she had bitten her tongue because it was swollen on the left side.  However since then she has had more swelling and states she is swelling under her tongue and under her dentures.  She states just prior to arrival she started having some trouble swallowing because of her enlarged tongue.  She denies any trouble breathing.  She states she has been itching for the past couple weeks and she thought it was because she changed laundry detergents.  She denies any change in her medications.  She states she is never had this happen before.  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  She has been on lisinopril for her blood pressure.  PCP [name], MD   Past Medical History:  Diagnosis Date  . Atrial fibrillation ([place])    a. occurred in 2000, never placed on anti-coagulation. No known recurrence.   Marland Kitchen CAD (coronary artery disease)    a. cath 05/2016: 99% stenosis RI (DES placed), mild non-obstructive disease RCA, 70% along small caliber diagonal branch  . HLD (hyperlipidemia)    a. intoelrant to statin therapy  . Hypertension     Patient Active Problem List   Diagnosis Date Noted  . CAD S/P PCI 05/31/2016  . Dyslipidemia, goal LDL below 70 05/15/2016  . Essential hypertension 05/15/2016  . Non-STEMI (non-ST elevated myocardial infarction) ([place]) 05/14/2016    Past Surgical History:  Procedure Laterality Date  . ABDOMINAL HYSTERECTOMY    . CARDIAC CATHETERIZATION N/A 05/14/2016   Procedure: Left Heart Cath and Coronary Angiography;  Surgeon: Burnell Blanks, MD;  Location: [facility];  Service:  Cardiovascular;  Laterality: N/A;  . CARDIAC CATHETERIZATION N/A 05/14/2016   Procedure: Coronary Stent Intervention;  Surgeon: Burnell Blanks, MD;  Location: [place] [facility];  Service: Cardiovascular;  Laterality: N/A;    OB History    No data available       Home Medications    Prior to Admission medications   Medication Sig Start Date End Date Taking? Authorizing Provider  AMBULATORY NON FORMULARY MEDICATION Take 180 mg by mouth daily. Medication Name: bempedoic acid, CLEAR Research Study drug provided 09/09/16   Lelon Perla, MD  aspirin EC 81 MG EC tablet Take 1 tablet (81 mg total) by mouth daily. 05/15/16   Strader, Fransisco Hertz, PA-C  carbamazepine (CARBATROL) 200 MG 12 hr capsule Take 200 mg by mouth 2 (two) times daily as needed.     [provider]  EPINEPHrine 0.3 mg/0.3 mL IJ SOAJ injection Inject 0.3 mg into the muscle once.    [provider]  ezetimibe (ZETIA) 10 MG tablet TAKE 1 TABLET BY MOUTH EVERY DAY 09/12/17   Arnoldo Lenis, MD  famotidine (PEPCID) 20 MG tablet Take 1 tablet (20 mg total) by mouth 2 (two) times daily. 12/01/17   Rolland Porter, MD  hydrochlorothiazide (HYDRODIURIL) 12.5 MG tablet Take 12.5 mg by mouth daily.    [provider]  lisinopril (PRINIVIL,ZESTRIL) 5 MG tablet TAKE 1 TABLET BY MOUTH EVERY DAY 09/07/17  Arnoldo Lenis, MD  lisinopril (PRINIVIL,ZESTRIL) 5 MG tablet TAKE 1 TABLET BY MOUTH EVERY DAY 09/12/17   Arnoldo Lenis, MD  metoprolol tartrate (LOPRESSOR) 25 MG tablet Take 0.5 tablets (12.5 mg total) by mouth 2 (two) times daily. 07/15/17   Arnoldo Lenis, MD  nitroGLYCERIN (NITROSTAT) 0.4 MG SL tablet ONE TABLET UNDER TONGUE AS NEEDED FOR CHEST PAIN EVERY 5 MINUTES FOR MAX OF 3 DOSES 07/15/17   Arnoldo Lenis, MD  predniSONE (DELTASONE) 20 MG tablet Take 3 po QD x 3d , then 2 po QD x 3d then 1 po QD x 3d 12/01/17   Rolland Porter, MD    Family History Family History  Problem Relation Age  of Onset  . Heart Problems Mother   . Stroke Father   . Diabetes Father   . Heart attack Brother   . Heart attack Maternal Grandfather   . Heart attack Brother     Social History Social History   Tobacco Use  . Smoking status: Never Smoker  . Smokeless tobacco: Never Used  Substance Use Topics  . Alcohol use: No  . Drug use: No     Allergies   Bee venom; Atorvastatin; Codeine; Erythromycin; Milk-related compounds; Morphine and related; Niacin and related; and Statins   Review of Systems Review of Systems  All other systems reviewed and are negative.    Physical Exam Updated Vital Signs BP (!) 182/84   Pulse 84   Temp (!) 97.5 F (36.4 C)   Resp 20   Wt 74.4 kg (164 lb)   SpO2 96%   BMI 24.94 kg/m   Vital signs normal except for hypertension   Physical Exam  Constitutional: She is oriented to person, place, and time. She appears well-developed and well-nourished.  Non-toxic appearance. She does not appear ill. No distress.  HENT:  Head: Normocephalic and atraumatic.  Right Ear: External ear normal.  Left Ear: External ear normal.  Nose: Nose normal. No mucosal edema or rhinorrhea.  Mouth/Throat: Oropharynx is clear and moist and mucous membranes are normal. No dental abscesses or uvula swelling.  Patient is able to keep her tongue in her mouth, however her speech is thick.  When she sticks out her tongue her tongue is noted to be very thickened.  There is also some swelling in the submandibular area consistent with her feeling that she has swelling under her tongue.  She is not drooling and appears to be handling her secretions well.  Eyes: Conjunctivae and EOM are normal. Pupils are equal, round, and reactive to light.  Neck: Normal range of motion and full passive range of motion without pain. Neck supple.  Cardiovascular: Normal rate, regular rhythm and normal heart sounds. Exam reveals no gallop and no friction rub.  No murmur heard. Pulmonary/Chest:  Effort normal and breath sounds normal. No respiratory distress. She has no wheezes. She has no rhonchi. She has no rales. She exhibits no tenderness and no crepitus.  Abdominal: Soft. Normal appearance and bowel sounds are normal. She exhibits no distension. There is no tenderness. There is no rebound and no guarding.  Musculoskeletal: Normal range of motion. She exhibits deformity. She exhibits no edema or tenderness.  Moves all extremities well.  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  Neurological: She is alert and oriented to person, place, and time. She has normal strength. No cranial nerve deficit.  Skin: Skin is warm, dry and intact. No rash  noted. No erythema. No pallor.  Psychiatric: She has a normal mood and affect. Her speech is normal and behavior is normal. Her mood appears not anxious.  Nursing note and vitals reviewed.           ED Treatments / Results  Labs (all labs ordered are listed, but only abnormal results are displayed) Labs Reviewed  TYPE AND SCREEN  PREPARE FRESH FROZEN PLASMA    EKG  EKG Interpretation None       Radiology No results found.  Procedures Procedures (including critical care time)  Medications Ordered in ED Medications  methylPREDNISolone sodium succinate (SOLU-MEDROL) 125 mg/2 mL injection 125 mg (125 mg Intravenous Given 12/01/17 0444)  diphenhydrAMINE (BENADRYL) injection 25 mg (25 mg Intravenous Given 12/01/17 0444)  famotidine (PEPCID) IVPB 20 mg premix (0 mg Intravenous Stopped 12/01/17 0522)  0.9 %  sodium chloride infusion ( Intravenous New Bag/Given 12/01/17 0442)     Initial Impression / Assessment and Plan / ED Course  I have reviewed the triage vital signs and the nursing notes.  Pertinent labs & imaging results that were available during my care of the patient were reviewed by me and considered in my medical decision making (see chart for details).     Patient was started on  the allergic reaction medications while waiting for fresh frozen plasma to be thawed. She is having a ACE inhibitor reaction with angioedema of her tongue.  I showed her her pill bottle of the lisinopril and told her that she needed to stop this blood pressure medication.  Recheck at 5:45 AM patient speech may be slightly better, she states she feels like her tongue is a little less swollen.  When I look at her tongue she still has some swelling, it may be mildly improved.  Recheck 6:35 AM her fresh frozen plasma is just starting to be infused.  Patient states her tongue feels less swollen, I do feel like now it does appear to be less swollen, her speech is much easier to understand.  07:20 AM patient's fresh frozen plasma is about halfway infused.  She states her swelling is much improved, she no longer feels like she has swelling around her dentures or under her tongue.  Her tongue now has some indentation in it.  Her speech is very clear now.      She does appear to be slowly improving.  She will be left at change of shift for recheck by Dr. Roderic Palau.  We discussed talking to her primary care doctor about her visit tonight, she understands she should not take the lisinopril or any of its related blood pressure medications anymore.  Her doctor may want to change her blood pressure medication regimen.  At the time of this documentation her blood pressure is 140/77.  Final Clinical Impressions(s) / ED Diagnoses   Final diagnoses:  Angioedema, initial encounter  Adverse reaction to ACE inhibitor drug, initial encounter    ED Discharge Orders        Ordered    predniSONE (DELTASONE) 20 MG tablet     12/01/17 0656    famotidine (PEPCID) 20 MG tablet  2 times daily     12/01/17 0656      Plan discharge  Rolland Porter, MD, Barbette Or, MD 12/01/17 [phone]

## 2017-12-01 NOTE — Discharge Instructions (Signed)
Stop the lisinopril!!! You can never take any medication in that class again, also called ACE inhibitors. Take the medications as prescribed. You can also take benadryl which can make your sleepy OR claritin OTC once a day for the next week. Return to the ED if your tongue starts to swell again. Please call your doctors office, they may need to adjust your blood pressure medications.

## 2017-12-02 LAB — BPAM FFP
Blood Product Expiration Date: 201903052359
ISSUE DATE / TIME: 201902280548
Unit Type and Rh: 8400

## 2017-12-02 LAB — PREPARE FRESH FROZEN PLASMA: Unit division: 0

## 2018-02-28 ENCOUNTER — Other Ambulatory Visit: Payer: Self-pay

## 2018-02-28 MED ORDER — METOPROLOL TARTRATE 25 MG PO TABS: 12.5000 mg | ORAL_TABLET | Freq: Two times a day (BID) | ORAL | 0 refills | Status: DC

## 2018-02-28 NOTE — Telephone Encounter (Signed)
Overdue for f/u needs apt for refills, 30 day supply given for metoprolol pharmacy messaged

## 2018-03-31 ENCOUNTER — Other Ambulatory Visit: Payer: Self-pay | Admitting: *Deleted

## 2018-03-31 MED ORDER — METOPROLOL TARTRATE 25 MG PO TABS: 12.5000 mg | ORAL_TABLET | Freq: Two times a day (BID) | ORAL | 0 refills | Status: DC

## 2018-04-03 ENCOUNTER — Other Ambulatory Visit: Payer: Self-pay

## 2018-04-04 ENCOUNTER — Other Ambulatory Visit: Payer: Self-pay

## 2018-04-04 MED ORDER — METOPROLOL TARTRATE 25 MG PO TABS: 12.5000 mg | ORAL_TABLET | Freq: Two times a day (BID) | ORAL | 0 refills | Status: DC

## 2018-04-04 NOTE — Telephone Encounter (Signed)
Refilled lopressor per protocol

## 2018-04-10 VITALS — BP 126/77 | Wt 164.0 lb

## 2018-04-10 DIAGNOSIS — Z006 Encounter for examination for normal comparison and control in clinical research program: Secondary | ICD-10-CM

## 2018-04-10 NOTE — Progress Notes (Signed)
Reed Breech to research clinic for visit [phone] in the Clear study.  No complaints or serious adverse events to report.  ED visit to be reported to sponsor. Subject 100% compliant with medications and new medication dispensed.  Next appointment scheduled.

## 2018-04-18 ENCOUNTER — Other Ambulatory Visit: Payer: Self-pay

## 2018-04-18 MED ORDER — METOPROLOL TARTRATE 25 MG PO TABS: 12.5000 mg | ORAL_TABLET | Freq: Two times a day (BID) | ORAL | 0 refills | Status: DC

## 2018-05-12 ENCOUNTER — Encounter: Payer: Self-pay | Admitting: Cardiology

## 2018-05-12 ENCOUNTER — Other Ambulatory Visit: Payer: Self-pay

## 2018-05-12 ENCOUNTER — Ambulatory Visit: Payer: Medicare Other | Admitting: Cardiology

## 2018-05-12 ENCOUNTER — Encounter: Payer: Self-pay | Admitting: *Deleted

## 2018-05-12 VITALS — BP 174/79 | HR 56 | Ht 67.0 in | Wt 167.0 lb

## 2018-05-12 DIAGNOSIS — I10 Essential (primary) hypertension: Secondary | ICD-10-CM | POA: Diagnosis not present

## 2018-05-12 DIAGNOSIS — I2510 Atherosclerotic heart disease of native coronary artery without angina pectoris: Secondary | ICD-10-CM

## 2018-05-12 DIAGNOSIS — E782 Mixed hyperlipidemia: Secondary | ICD-10-CM | POA: Diagnosis not present

## 2018-05-12 NOTE — Progress Notes (Signed)
Clinical Summary Ms. Bulger is a 72 y.o.female seen today for follow up of the following medical problems.   1. CAD - admit NSTEMI 05/2016, received DES to ramus. Echo at that time LVEF 55-60%, basalinferior hypokinesis.  - beta blocker limited due to bradycardia - difficultly affording brillinta, she was to enroll in patient assistance.  - dry cough last visit, unclear if related to ACE-I. She did not want to change to ARB at that time.   - lisinopirl stopped due to tongue swelling during ER visit 11/2017.  - no recent chest pain, no SOB or DOE - compliant with meds  2. Hyperlipidemia - statin intolerant. Started on zetia during 05/2016 admission. - part of CLEAR research study  - remains compliant with zetia.   3. HTN - compliant with meds - checks bp at home 3-4 times a week, typically around 110s/70s. She reports history white coat HTN  - home bp's around 110s/60-80s. Checks about once a week.  - has not taken meds yet today.    SH: working at [employer]   Past Medical History:  Diagnosis Date  . Atrial fibrillation ([place])    a. occurred in 2000, never placed on anti-coagulation. No known recurrence.   Marland Kitchen CAD (coronary artery disease)    a. cath 05/2016: 99% stenosis RI (DES placed), mild non-obstructive disease RCA, 70% along small caliber diagonal Jaren Vanetten  . HLD (hyperlipidemia)    a. intoelrant to statin therapy  . Hypertension      Allergies  Allergen Reactions  . Bee Venom Anaphylaxis and Swelling  . Lisinopril Swelling  . Atorvastatin   . Codeine Nausea And Vomiting  . Erythromycin   . Milk-Related Compounds Other (See Comments)    Rash in inside of mouth , digestive issues  . Morphine And Related Nausea And Vomiting  . Niacin And Related   . Statins      Current Outpatient Medications  Medication Sig Dispense Refill  . AMBULATORY NON FORMULARY MEDICATION Take 180 mg by mouth daily. Medication Name: bempedoic acid, CLEAR Research Study  drug provided    . aspirin EC 81 MG EC tablet Take 1 tablet (81 mg total) by mouth daily.    . carbamazepine (CARBATROL) 200 MG 12 hr capsule Take 200 mg by mouth 2 (two) times daily as needed.     Marland Kitchen EPINEPHrine 0.3 mg/0.3 mL IJ SOAJ injection Inject 0.3 mg into the muscle once.    . ezetimibe (ZETIA) 10 MG tablet TAKE 1 TABLET BY MOUTH EVERY DAY 90 tablet 3  . famotidine (PEPCID) 20 MG tablet Take 1 tablet (20 mg total) by mouth 2 (two) times daily. 10 tablet 0  . hydrochlorothiazide (HYDRODIURIL) 12.5 MG tablet Take 12.5 mg by mouth daily.    Marland Kitchen lisinopril (PRINIVIL,ZESTRIL) 5 MG tablet TAKE 1 TABLET BY MOUTH EVERY DAY 90 tablet 3  . lisinopril (PRINIVIL,ZESTRIL) 5 MG tablet TAKE 1 TABLET BY MOUTH EVERY DAY 90 tablet 3  . metoprolol tartrate (LOPRESSOR) 25 MG tablet Take 0.5 tablets (12.5 mg total) by mouth 2 (two) times daily. 15 tablet 0  . nitroGLYCERIN (NITROSTAT) 0.4 MG SL tablet ONE TABLET UNDER TONGUE AS NEEDED FOR CHEST PAIN EVERY 5 MINUTES FOR MAX OF 3 DOSES 75 tablet 1  . predniSONE (DELTASONE) 20 MG tablet Take 3 po QD x 3d , then 2 po QD x 3d then 1 po QD x 3d 14 tablet 0   No current facility-administered medications for this visit.  Past Surgical History:  Procedure Laterality Date  . ABDOMINAL HYSTERECTOMY    . CARDIAC CATHETERIZATION N/A 05/14/2016   Procedure: Left Heart Cath and Coronary Angiography;  Surgeon: Burnell Blanks, MD;  Location: [facility];  Service: Cardiovascular;  Laterality: N/A;  . CARDIAC CATHETERIZATION N/A 05/14/2016   Procedure: Coronary Stent Intervention;  Surgeon: Burnell Blanks, MD;  Location: [facility];  Service: Cardiovascular;  Laterality: N/A;     Allergies  Allergen Reactions  . Bee Venom Anaphylaxis and Swelling  . Lisinopril Swelling  . Atorvastatin   . Codeine Nausea And Vomiting  . Erythromycin   . Milk-Related Compounds Other (See Comments)    Rash in inside of mouth , digestive issues  .  Morphine And Related Nausea And Vomiting  . Niacin And Related   . Statins       Family History  Problem Relation Age of Onset  . Heart Problems Mother   . Stroke Father   . Diabetes Father   . Heart attack Brother   . Heart attack Maternal Grandfather   . Heart attack Brother      Social History Ms. Klassen reports that she has never smoked. She has never used smokeless tobacco. Ms. Shasteen reports that she does not drink alcohol.   Review of Systems CONSTITUTIONAL: No weight loss, fever, chills, weakness or fatigue.  HEENT: Eyes: No visual loss, blurred vision, double vision or yellow sclerae.No hearing loss, sneezing, congestion, runny nose or sore throat.  SKIN: No rash or itching.  CARDIOVASCULAR: per hpi RESPIRATORY: No shortness of breath, cough or sputum.  GASTROINTESTINAL: No anorexia, nausea, vomiting or diarrhea. No abdominal pain or blood.  GENITOURINARY: No burning on urination, no polyuria NEUROLOGICAL: No headache, dizziness, syncope, paralysis, ataxia, numbness or tingling in the extremities. No change in bowel or bladder control.  MUSCULOSKELETAL: No muscle, back pain, joint pain or stiffness.  LYMPHATICS: No enlarged nodes. No history of splenectomy.  PSYCHIATRIC: No history of depression or anxiety.  ENDOCRINOLOGIC: No reports of sweating, cold or heat intolerance. No polyuria or polydipsia.  Marland Kitchen   Physical Examination Vitals:   05/12/18 1059  BP: (!) 174/79  Pulse: (!) 56  SpO2: 99%   Vitals:   05/12/18 1059  Weight: 167 lb (75.8 kg)  Height: 5\' 7"  (1.702 m)    Gen: resting comfortably, no acute distress HEENT: no scleral icterus, pupils equal round and reactive, no palptable cervical adenopathy,  CV: RRR, no m/r/g, no jvd Resp: Clear to auscultation bilaterally GI: abdomen is soft, non-tender, non-distended, normal bowel sounds, no hepatosplenomegaly MSK: extremities are warm, no edema.  Skin: warm, no rash Neuro:  no focal deficits Psych:  appropriate affect   Diagnostic Studies 05/2016 cath   Prox RCA to Mid RCA lesion, 40 %stenosed.  Ost Ramus lesion, 30 %stenosed.  A STENT PROMUS PREM MR 2.25X16 drug eluting stent was successfully placed.  Ramus lesion, 99 %stenosed.  Post intervention, there is a 0% residual stenosis.  1st Diag lesion, 70 %stenosed.  The left ventricular systolic function is normal.  LV end diastolic pressure is normal.  The left ventricular ejection fraction is 55-65% by visual estimate.  There is no mitral valve regurgitation.  1. Severe stenosis mid Ramus intermediate Jaydalynn Olivero, culprit for NSTEMI 2. Mild non-obstructive disease in the RCA.  3. Moderate stenosis small caliber Diagonal Paris Hohn 4. Normal LV systolic function 5. Successful PTCA/DES x 1 mid Ramus intermediate Mats Jeanlouis  Recommendations: Continue DAPT with ASA and  Brilinta for one year. She is statin intolerant. Consider PCSK9-inh after discharge. Start beta blocker. Follow up in [facility] office.      Assessment and Plan  1. CAD - no symptoms, continue current meds    2. Hyperlipidemia - intolerant to statins. She will conitnue zetia, she is part of CLEAR study  3. HTN - has not taken meds yet today, bp's are elevated, Home bp's are at goal -continue current meds - off lisionpril due to angioedema, have not started ARB due to cross reactivity risk.      F/u 6 months      Arnoldo Lenis, M.D.

## 2018-05-12 NOTE — Patient Instructions (Signed)

## 2018-05-23 ENCOUNTER — Other Ambulatory Visit: Payer: Self-pay | Admitting: *Deleted

## 2018-05-23 MED ORDER — METOPROLOL TARTRATE 25 MG PO TABS: 12.5000 mg | ORAL_TABLET | Freq: Two times a day (BID) | ORAL | 6 refills | Status: DC

## 2018-07-27 ENCOUNTER — Encounter: Payer: Self-pay | Admitting: Gastroenterology

## 2018-09-05 ENCOUNTER — Telehealth: Payer: Self-pay | Admitting: *Deleted

## 2018-09-05 NOTE — Telephone Encounter (Signed)
Clear follow up phone call [phone]

## 2018-09-25 ENCOUNTER — Other Ambulatory Visit: Payer: Self-pay | Admitting: Cardiology

## 2018-10-10 VITALS — BP 183/87 | HR 57

## 2018-10-10 DIAGNOSIS — Z006 Encounter for examination for normal comparison and control in clinical research program: Secondary | ICD-10-CM

## 2018-10-10 NOTE — Research (Signed)
Reed Breech to research clinic for visit [phone] in the Clear study.  No complaints, adverse events, or serious adverse events to report.  Subject 98% compliant with medications and new medication dispensed.  Next appointment scheduled.

## 2018-10-24 DIAGNOSIS — M8580 Other specified disorders of bone density and structure, unspecified site: Secondary | ICD-10-CM | POA: Diagnosis not present

## 2018-10-24 DIAGNOSIS — I10 Essential (primary) hypertension: Secondary | ICD-10-CM | POA: Diagnosis not present

## 2018-10-24 DIAGNOSIS — R21 Rash and other nonspecific skin eruption: Secondary | ICD-10-CM | POA: Diagnosis not present

## 2018-10-24 DIAGNOSIS — Z23 Encounter for immunization: Secondary | ICD-10-CM | POA: Diagnosis not present

## 2018-11-01 ENCOUNTER — Encounter: Payer: Self-pay | Admitting: Gastroenterology

## 2018-11-01 ENCOUNTER — Ambulatory Visit: Payer: PPO | Admitting: Gastroenterology

## 2018-11-01 ENCOUNTER — Encounter: Payer: Self-pay | Admitting: *Deleted

## 2018-11-01 ENCOUNTER — Other Ambulatory Visit: Payer: Self-pay | Admitting: *Deleted

## 2018-11-01 DIAGNOSIS — R195 Other fecal abnormalities: Secondary | ICD-10-CM | POA: Diagnosis not present

## 2018-11-01 MED ORDER — PEG 3350-KCL-NA BICARB-NACL 420 G PO SOLR: 4000.0000 mL | Freq: Once | ORAL | 0 refills | Status: AC

## 2018-11-01 NOTE — Patient Instructions (Signed)
We have scheduled a colonoscopy with Dr. Oneida Alar in the near future.  Further recommendations to follow!  It was a pleasure to see you today. I strive to create trusting relationships with patients to provide genuine, compassionate, and quality care. I value your feedback. If you receive a survey regarding your visit,  I greatly appreciate you taking time to fill this out.   Annitta Needs, PhD, ANP-BC [facility]

## 2018-11-01 NOTE — Progress Notes (Signed)
CC'D TO PCP °

## 2018-11-01 NOTE — Assessment & Plan Note (Signed)
73 year old female with heme positive stool, no prior colonoscopy, no FH of colon cancer or colorectal polyps. No concerning lower or upper GI signs/symptoms. She has had no overt GI bleeding.  Proceed with colonoscopy with Dr. Oneida Alar in the near future. The risks, benefits, and alternatives have been discussed in detail with the patient. They state understanding and desire to proceed.

## 2018-11-01 NOTE — Progress Notes (Addendum)
REVIEWED. TCS FOR HEME POSITIVE STOOLS.  Primary Care Physician:  [name], MD Primary Gastroenterologist:  Dr. Oneida Alar   Chief Complaint  Patient presents with  . Colonoscopy    had positive iFOBT    HPI:   Tricia Savage is a 72 y.o. female who looks much younger than stated age, presenting today at the request of Bryson Ha Marshall/Dr. Karie Kirks for heme positive stool.    Doesn't believe she has ever had a colonoscopy. No overt GI bleeding. No constipation or diarrhea. Allergic to milk. Can't do grease meat as it goes "right through her". Eats slowly. No GERD. No FH of colon cancer or colon polyps. Interesting prior history of severe allergic reaction with angioedema secondary to ACE inhibitor Feb 2019.      Past Medical History:  Diagnosis Date  . Atrial fibrillation ([place])    a. occurred in 2000, never placed on anti-coagulation. No known recurrence.   Marland Kitchen CAD (coronary artery disease)    a. cath 05/2016: 99% stenosis RI (DES placed), mild non-obstructive disease RCA, 70% along small caliber diagonal branch  . HLD (hyperlipidemia)    a. intoelrant to statin therapy  . Hypertension   . Trigeminal neuralgia     Past Surgical History:  Procedure Laterality Date  . ABDOMINAL HYSTERECTOMY    . CARDIAC CATHETERIZATION N/A 05/14/2016   Procedure: Left Heart Cath and Coronary Angiography;  Surgeon: Burnell Blanks, MD;  Location: [place] [facility];  Service: Cardiovascular;  Laterality: N/A;  . CARDIAC CATHETERIZATION N/A 05/14/2016   Procedure: Coronary Stent Intervention;  Surgeon: Burnell Blanks, MD;  Location: [place] [facility];  Service: Cardiovascular;  Laterality: N/A;  . 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     Current Outpatient Medications  Medication Sig Dispense Refill  . AMBULATORY NON FORMULARY MEDICATION Take 180 mg by mouth daily. Medication Name: bempedoic acid, CLEAR Research Study drug provided    . aspirin EC 81  MG EC tablet Take 1 tablet (81 mg total) by mouth daily.    . carbamazepine (CARBATROL) 200 MG 12 hr capsule Take 200 mg by mouth 2 (two) times daily as needed.     . ezetimibe (ZETIA) 10 MG tablet TAKE 1 TABLET BY MOUTH EVERY DAY 90 tablet 3  . hydrochlorothiazide (HYDRODIURIL) 12.5 MG tablet Take 12.5 mg by mouth daily.    Marland Kitchen loratadine (CLARITIN) 10 MG tablet Take 10 mg by mouth daily as needed for allergies.    . metoprolol tartrate (LOPRESSOR) 25 MG tablet Take 0.5 tablets (12.5 mg total) by mouth 2 (two) times daily. (Patient taking differently: Take 25 mg by mouth 2 (two) times daily. ) 30 tablet 6   No current facility-administered medications for this visit.     Allergies as of 11/01/2018 - Review Complete 11/01/2018  Allergen Reaction Noted  . Bee venom Anaphylaxis and Swelling 05/31/2016  . Lisinopril Swelling 12/01/2017  . Atorvastatin  05/14/2016  . Codeine Nausea And Vomiting 05/14/2016  . Erythromycin  05/14/2016  . Milk-related compounds Other (See Comments) 05/31/2016  . Morphine and related Nausea And Vomiting 05/14/2016  . Niacin and related  05/14/2016  . Statins  05/14/2016    Family History  Problem Relation Age of Onset  . Heart Problems Mother   . Stroke Father   . Diabetes Father   . Heart attack Brother   . Heart attack Maternal Grandfather   . Heart attack Brother   .  Colon cancer Neg Hx   . Colon polyps Neg Hx     Social History   Socioeconomic History  . Marital status: Divorced    Spouse name: Not on file  . Number of children: Not on file  . Years of education: Not on file  . Highest education level: Not on file  Occupational History  . Not on file  Social Needs  . Financial resource strain: Not on file  . Food insecurity:    Worry: Not on file    Inability: Not on file  . Transportation needs:    Medical: Not on file    Non-medical: Not on file  Tobacco Use  . Smoking status: Never Smoker  . Smokeless tobacco: Never Used    Substance and Sexual Activity  . Alcohol use: No  . Drug use: No  . Sexual activity: Not Currently  Lifestyle  . Physical activity:    Days per week: Not on file    Minutes per session: Not on file  . Stress: Not on file  Relationships  . Social connections:    Talks on phone: Not on file    Gets together: Not on file    Attends religious service: Not on file    Active member of club or organization: Not on file    Attends meetings of clubs or organizations: Not on file    Relationship status: Not on file  . Intimate partner violence:    Fear of current or ex partner: Not on file    Emotionally abused: Not on file    Physically abused: Not on file    Forced sexual activity: Not on file  Other Topics Concern  . Not on file  Social History Narrative  . Not on file    Review of Systems: Gen: Denies any fever, chills, fatigue, weight loss, lack of appetite.  CV: Denies chest pain, heart palpitations, peripheral edema, syncope.  Resp: Denies shortness of breath at rest or with exertion. Denies wheezing or cough.  GI: see HPI GU : Denies urinary burning, urinary frequency, urinary hesitancy MS: Denies joint pain, muscle weakness, cramps, or limitation of movement.  Derm: Denies rash, itching, dry skin Psych: Denies depression, anxiety, memory loss, and confusion Heme: Denies bruising, bleeding, and enlarged lymph nodes.  Physical Exam: BP (!) 141/83   Pulse (!) 58   Temp 98.2 F (36.8 C) (Oral)   Ht 5' 8.5" (1.74 m)   Wt 171 lb 6.4 oz (77.7 kg)   BMI 25.68 kg/m  General:   Alert and oriented. Pleasant and cooperative. Well-nourished and well-developed. Appears much younger than age. Head:  Normocephalic and atraumatic. Eyes:  Without icterus, sclera clear and conjunctiva pink.  Ears:  Normal auditory acuity. Nose:  No deformity, discharge,  or lesions. Mouth:  No deformity or lesions, oral mucosa pink.  Lungs:  Clear to auscultation bilaterally. No wheezes, rales, or  rhonchi. No distress.  Heart:  S1, S2 present without murmurs appreciated.  Abdomen:  +BS, soft, non-tender and non-distended. No HSM noted. No guarding or rebound. No masses appreciated.  Rectal:  Deferred  Msk:  Left index finger amputated  Extremities:  Without edema. Neurologic:  Alert and  oriented x4;  grossly normal neurologically. Psych:  Alert and cooperative. Normal mood and affect.

## 2018-11-03 ENCOUNTER — Encounter: Payer: Self-pay | Admitting: *Deleted

## 2018-11-09 ENCOUNTER — Encounter: Payer: Self-pay | Admitting: Cardiology

## 2018-11-09 ENCOUNTER — Encounter: Payer: Self-pay | Admitting: *Deleted

## 2018-11-09 ENCOUNTER — Ambulatory Visit: Payer: Medicare Other | Admitting: Cardiology

## 2018-11-09 VITALS — BP 136/67 | HR 50 | Ht 68.5 in | Wt 169.4 lb

## 2018-11-09 DIAGNOSIS — E782 Mixed hyperlipidemia: Secondary | ICD-10-CM

## 2018-11-09 DIAGNOSIS — I10 Essential (primary) hypertension: Secondary | ICD-10-CM

## 2018-11-09 DIAGNOSIS — I2510 Atherosclerotic heart disease of native coronary artery without angina pectoris: Secondary | ICD-10-CM

## 2018-11-09 NOTE — Patient Instructions (Addendum)

## 2018-11-09 NOTE — Progress Notes (Signed)
Clinical Summary Ms. Talton is a 73 y.o.female seen today for follow up of the following medical problems.  1. CAD - admit NSTEMI 05/2016, received DES to ramus. Echo at that time LVEF 55-60%, basalinferior hypokinesis.  - beta blocker limited due to bradycardia - lisinopirl stopped due to tongue swelling during ER visit 11/2017.    - no chest pain. No SOB/DOE - compliant with meds.   2. Hyperlipidemia - statin intolerant. Started on zetia during 05/2016 admission. - part of CLEAR research study  - remains compliant with zetia.   3. HTN - compliant with meds   4. Heme + stool - plans for colonscopy by GI in near future.     SH: working at [employer] Past Medical History:  Diagnosis Date  . Atrial fibrillation ([place])    a. occurred in 2000, never placed on anti-coagulation. No known recurrence.   Marland Kitchen CAD (coronary artery disease)    a. cath 05/2016: 99% stenosis RI (DES placed), mild non-obstructive disease RCA, 70% along small caliber diagonal Itali Mckendry  . HLD (hyperlipidemia)    a. intoelrant to statin therapy  . Hypertension   . Trigeminal neuralgia      Allergies  Allergen Reactions  . Bee Venom Anaphylaxis and Swelling  . Lisinopril Swelling  . Atorvastatin   . Codeine Nausea And Vomiting  . Erythromycin   . Milk-Related Compounds Other (See Comments)    Rash in inside of mouth , digestive issues  . Morphine And Related Nausea And Vomiting  . Niacin And Related   . Statins      Current Outpatient Medications  Medication Sig Dispense Refill  . AMBULATORY NON FORMULARY MEDICATION Take 180 mg by mouth daily. Medication Name: bempedoic acid, CLEAR Research Study drug provided    . aspirin EC 81 MG EC tablet Take 1 tablet (81 mg total) by mouth daily.    . carbamazepine (CARBATROL) 200 MG 12 hr capsule Take 200 mg by mouth 2 (two) times daily as needed.     . ezetimibe (ZETIA) 10 MG tablet TAKE 1 TABLET BY MOUTH EVERY DAY 90 tablet 3  .  hydrochlorothiazide (HYDRODIURIL) 12.5 MG tablet Take 12.5 mg by mouth daily.    Marland Kitchen loratadine (CLARITIN) 10 MG tablet Take 10 mg by mouth daily as needed for allergies.    . metoprolol tartrate (LOPRESSOR) 25 MG tablet Take 0.5 tablets (12.5 mg total) by mouth 2 (two) times daily. (Patient taking differently: Take 25 mg by mouth 2 (two) times daily. ) 30 tablet 6   No current facility-administered medications for this visit.      Past Surgical History:  Procedure Laterality Date  . ABDOMINAL HYSTERECTOMY    . CARDIAC CATHETERIZATION N/A 05/14/2016   Procedure: Left Heart Cath and Coronary Angiography;  Surgeon: Burnell Blanks, MD;  Location: [place] [facility];  Service: Cardiovascular;  Laterality: N/A;  . CARDIAC CATHETERIZATION N/A 05/14/2016   Procedure: Coronary Stent Intervention;  Surgeon: Burnell Blanks, MD;  Location: [place] [facility];  Service: Cardiovascular;  Laterality: N/A;  . 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      Allergies  Allergen Reactions  . Bee Venom Anaphylaxis and Swelling  . Lisinopril Swelling  . Atorvastatin   . Codeine Nausea And Vomiting  . Erythromycin   . Milk-Related Compounds Other (See Comments)    Rash in inside of mouth , digestive issues  . Morphine And Related Nausea  And Vomiting  . Niacin And Related   . Statins       Family History  Problem Relation Age of Onset  . Heart Problems Mother   . Stroke Father   . Diabetes Father   . Heart attack Brother   . Heart attack Maternal Grandfather   . Heart attack Brother   . Colon cancer Neg Hx   . Colon polyps Neg Hx      Social History Ms. Tagliaferro reports that she has never smoked. She has never used smokeless tobacco. Ms. Latif reports no history of alcohol use.   Review of Systems CONSTITUTIONAL: No weight loss, fever, chills, weakness or fatigue.  HEENT: Eyes: No visual loss, blurred vision, double vision or yellow sclerae.No hearing loss,  sneezing, congestion, runny nose or sore throat.  SKIN: No rash or itching.  CARDIOVASCULAR: per hpi RESPIRATORY: No shortness of breath, cough or sputum.  GASTROINTESTINAL: No anorexia, nausea, vomiting or diarrhea. No abdominal pain or blood.  GENITOURINARY: No burning on urination, no polyuria NEUROLOGICAL: No headache, dizziness, syncope, paralysis, ataxia, numbness or tingling in the extremities. No change in bowel or bladder control.  MUSCULOSKELETAL: No muscle, back pain, joint pain or stiffness.  LYMPHATICS: No enlarged nodes. No history of splenectomy.  PSYCHIATRIC: No history of depression or anxiety.  ENDOCRINOLOGIC: No reports of sweating, cold or heat intolerance. No polyuria or polydipsia.  Marland Kitchen   Physical Examination Vitals:   11/09/18 1005  BP: 136/67  Pulse: (!) 50  SpO2: 96%   Vitals:   11/09/18 1005  Weight: 169 lb 6.4 oz (76.8 kg)  Height: 5' 8.5" (1.74 m)    Gen: resting comfortably, no acute distress HEENT: no scleral icterus, pupils equal round and reactive, no palptable cervical adenopathy,  CV: RRR, no mr/g, no jvd Resp: Clear to auscultation bilaterally GI: abdomen is soft, non-tender, non-distended, normal bowel sounds, no hepatosplenomegaly MSK: extremities are warm, no edema.  Skin: warm, no rash Neuro:  no focal deficits Psych: appropriate affect   Diagnostic Studies 05/2016 cath   Prox RCA to Mid RCA lesion, 40 %stenosed.  Ost Ramus lesion, 30 %stenosed.  A STENT PROMUS PREM MR 2.25X16 drug eluting stent was successfully placed.  Ramus lesion, 99 %stenosed.  Post intervention, there is a 0% residual stenosis.  1st Diag lesion, 70 %stenosed.  The left ventricular systolic function is normal.  LV end diastolic pressure is normal.  The left ventricular ejection fraction is 55-65% by visual estimate.  There is no mitral valve regurgitation.  1. Severe stenosis mid Ramus intermediate Ida Milbrath, culprit for NSTEMI 2. Mild  non-obstructive disease in the RCA.  3. Moderate stenosis small caliber Diagonal Singleton [place]ox 4. Normal LV systolic function 5. Successful PTCA/DES x 1 mid Ramus intermediate Raegen Tarpley  Recommendations: Continue DAPT with ASA and Brilinta for one year. She is statin intolerant. Consider PCSK9-inh after discharge. Start beta blocker. [facility] in [facility] office.     Assessment and Plan  1. CAD - no recent symptoms. Continue current meds.  - ACE allergy with angioedema, have not tried ARB due to risk of cross reactivity.     2. Hyperlipidemia - intolerant to statins.  - continue zetia  3. HTN - overall at goal, continue current meds      Arnoldo Lenis, M.D

## 2018-12-21 ENCOUNTER — Telehealth: Payer: Self-pay | Admitting: *Deleted

## 2018-12-21 NOTE — Telephone Encounter (Signed)
LMOVM for pt 

## 2018-12-21 NOTE — Telephone Encounter (Signed)
Spoke with patient and she still wanted procedure to be done on 3/23 at 8:30am. Patient aware to arrive at 7:30am. Day of procedure she will drink 2nd half of prep at 3:30am, nothing after 5:30am. I called endo scheduler and LMOVM making aware of change

## 2018-12-25 ENCOUNTER — Other Ambulatory Visit: Payer: Self-pay

## 2018-12-25 ENCOUNTER — Ambulatory Visit (HOSPITAL_COMMUNITY)
Admission: RE | Admit: 2018-12-25 | Discharge: 2018-12-25 | Disposition: A | Discharge status: Home / Self Care | Payer: PPO | Attending: Gastroenterology | Admitting: Gastroenterology

## 2018-12-25 ENCOUNTER — Encounter (HOSPITAL_COMMUNITY): Payer: Self-pay | Admitting: *Deleted

## 2018-12-25 ENCOUNTER — Encounter (HOSPITAL_COMMUNITY)
Admission: RE | Disposition: A | Discharge status: Home / Self Care | Payer: Self-pay | Source: Home / Self Care | Attending: Gastroenterology

## 2018-12-25 DIAGNOSIS — Z888 Allergy status to other drugs, medicaments and biological substances status: Secondary | ICD-10-CM | POA: Diagnosis not present

## 2018-12-25 DIAGNOSIS — Z885 Allergy status to narcotic agent status: Secondary | ICD-10-CM | POA: Insufficient documentation

## 2018-12-25 DIAGNOSIS — I10 Essential (primary) hypertension: Secondary | ICD-10-CM | POA: Diagnosis not present

## 2018-12-25 DIAGNOSIS — R195 Other fecal abnormalities: Secondary | ICD-10-CM | POA: Diagnosis not present

## 2018-12-25 DIAGNOSIS — Z89022 Acquired absence of left finger(s): Secondary | ICD-10-CM | POA: Insufficient documentation

## 2018-12-25 DIAGNOSIS — Z881 Allergy status to other antibiotic agents status: Secondary | ICD-10-CM | POA: Diagnosis not present

## 2018-12-25 DIAGNOSIS — I2510 Atherosclerotic heart disease of native coronary artery without angina pectoris: Secondary | ICD-10-CM | POA: Insufficient documentation

## 2018-12-25 DIAGNOSIS — D123 Benign neoplasm of transverse colon: Secondary | ICD-10-CM

## 2018-12-25 DIAGNOSIS — Z7982 Long term (current) use of aspirin: Secondary | ICD-10-CM | POA: Insufficient documentation

## 2018-12-25 DIAGNOSIS — Z951 Presence of aortocoronary bypass graft: Secondary | ICD-10-CM | POA: Insufficient documentation

## 2018-12-25 DIAGNOSIS — K635 Polyp of colon: Secondary | ICD-10-CM | POA: Insufficient documentation

## 2018-12-25 DIAGNOSIS — K648 Other hemorrhoids: Secondary | ICD-10-CM | POA: Diagnosis not present

## 2018-12-25 DIAGNOSIS — E785 Hyperlipidemia, unspecified: Secondary | ICD-10-CM | POA: Insufficient documentation

## 2018-12-25 DIAGNOSIS — D124 Benign neoplasm of descending colon: Secondary | ICD-10-CM | POA: Diagnosis not present

## 2018-12-25 HISTORY — PX: POLYPECTOMY: SHX5525

## 2018-12-25 HISTORY — PX: COLONOSCOPY: SHX5424

## 2018-12-25 LAB — CBC
HCT: 39.6 % (ref 36.0–46.0)
Hemoglobin: 12.5 g/dL (ref 12.0–15.0)
MCH: 29.9 pg (ref 26.0–34.0)
MCHC: 31.6 g/dL (ref 30.0–36.0)
MCV: 94.7 fL (ref 80.0–100.0)
Platelets: 210 10*3/uL (ref 150–400)
RBC: 4.18 MIL/uL (ref 3.87–5.11)
RDW: 12 % (ref 11.5–15.5)
WBC: 4.9 10*3/uL (ref 4.0–10.5)
nRBC: 0 % (ref 0.0–0.2)

## 2018-12-25 SURGERY — COLONOSCOPY
Anesthesia: Moderate Sedation

## 2018-12-25 MED ORDER — MIDAZOLAM HCL 5 MG/5ML IJ SOLN
INTRAMUSCULAR | Status: AC
  Filled 2018-12-25: qty 10

## 2018-12-25 MED ORDER — ATROPINE SULFATE 1 MG/ML IJ SOLN
INTRAMUSCULAR | Status: AC
  Filled 2018-12-25: qty 1

## 2018-12-25 MED ORDER — MIDAZOLAM HCL 5 MG/5ML IJ SOLN
INTRAMUSCULAR | Status: DC | PRN
  Administered 2018-12-25: 1 mg via INTRAVENOUS
  Administered 2018-12-25: 2 mg via INTRAVENOUS

## 2018-12-25 MED ORDER — ONDANSETRON HCL 4 MG/2ML IJ SOLN
INTRAMUSCULAR | Status: AC
  Filled 2018-12-25: qty 2

## 2018-12-25 MED ORDER — OMEPRAZOLE 20 MG PO CPDR: DELAYED_RELEASE_CAPSULE | ORAL | 3 refills | Status: DC

## 2018-12-25 MED ORDER — ATROPINE SULFATE 1 MG/ML IJ SOLN
INTRAMUSCULAR | Status: DC | PRN
  Administered 2018-12-25: .5 mg via INTRAVENOUS

## 2018-12-25 MED ORDER — ONDANSETRON HCL 4 MG/2ML IJ SOLN
INTRAMUSCULAR | Status: DC | PRN
  Administered 2018-12-25: 4 mg via INTRAVENOUS

## 2018-12-25 MED ORDER — SODIUM CHLORIDE 0.9 % IV SOLN
INTRAVENOUS | Status: DC
  Administered 2018-12-25: 08:00:00 via INTRAVENOUS

## 2018-12-25 MED ORDER — MEPERIDINE HCL 100 MG/ML IJ SOLN
INTRAMUSCULAR | Status: AC
  Filled 2018-12-25: qty 2

## 2018-12-25 MED ORDER — MEPERIDINE HCL 100 MG/ML IJ SOLN
INTRAMUSCULAR | Status: DC | PRN
  Administered 2018-12-25 (×2): 25 mg

## 2018-12-25 NOTE — Op Note (Addendum)
[facility] Patient Name: Tricia Savage Procedure Date: 12/25/2018 8:12 AM MRN: [id number] Date of Birth: Apr 13, 1946 Attending MD: Barney Drain MD, MD CSN: [id number] Age: 73 Admit Type: Outpatient Procedure:                Colonoscopy WITH COLD SNARE POLYPECTOMY Indications:              Heme positive stool Providers:                Barney Drain MD, MD, Janeece Riggers, RN, Raphael Gibney, Technician, Randa Spike, Technician Referring MD:             [name] Medicines:                Ondansetron 4 mg IV, Meperidine 50 mg IV, Midazolam                            1 mg IV Complications:            No immediate complications. Estimated Blood Loss:     Estimated blood loss was minimal. Procedure:                Pre-Anesthesia Assessment:                           - Prior to the procedure, a History and Physical                            was performed, and patient medications and                            allergies were reviewed. The patient's tolerance of                            previous anesthesia was also reviewed. The risks                            and benefits of the procedure and the sedation                            options and risks were discussed with the patient.                            All questions were answered, and informed consent                            was obtained. Prior Anticoagulants: The patient has                            taken aspirin, last dose was day of procedure. ASA                            Grade Assessment: II - A patient with mild systemic  disease. After reviewing the risks and benefits,                            the patient was deemed in satisfactory condition to                            undergo the procedure. After obtaining informed                            consent, the colonoscope was passed under direct                            vision. Throughout the procedure, the  patient's                            blood pressure, pulse, and oxygen saturations were                            monitored continuously. The [id number]-[id number] ([id number])                            scope was introduced through the anus and advanced                            to the 5 cm into the ileum. The colonoscopy was                            technically difficult and complex due to                            significant looping. Successful completion of the                            procedure was aided by straightening and shortening                            the scope to obtain bowel loop reduction and                            COLOWRAP. The patient tolerated the procedure well.                            The quality of the bowel preparation was excellent.                            The terminal ileum, ileocecal valve, appendiceal                            orifice, and rectum were photographed. Scope In: 9:29:47 AM Scope Out: 9:57:27 AM Scope Withdrawal Time: 0 hours 19 minutes 47 seconds  Total Procedure Duration: 0 hours 27 minutes 40 seconds  Findings:      The terminal ileum appeared normal.      A 4 mm polyp was found  in the mid transverse colon. The polyp was       sessile. The polyp was removed with a cold snare. Resection and       retrieval were complete.      The recto-sigmoid colon, sigmoid colon, descending colon and splenic       flexure revealed significantly excessive looping.      External and internal hemorrhoids were found. The hemorrhoids were SMALL Impression:               - The examined portion of the ileum was normal.                           - One 4 mm polyp in the mid transverse colon,                            removed with a cold snare. Resected and retrieved.                           - There was significant looping of the colon.                           - HEME POSITIVE STOOLS LIKELY DUE TO internal                             hemorrhoids/GASTRITIS. Moderate Sedation:      Moderate (conscious) sedation was administered by the endoscopy nurse       and supervised by the endoscopist. The following parameters were       monitored: oxygen saturation, heart rate, blood pressure, and response       to care. Total physician intraservice time was 40 minutes. Recommendation:           - Patient has a contact number available for                            emergencies. The signs and symptoms of potential                            delayed complications were discussed with the                            patient. Return to normal activities tomorrow.                            Written discharge instructions were provided to the                            patient.                           - High fiber diet.                           - Continue present medications. ADD OMEPRAZOLE                            DAILY TO PREVENT  PUD/GASTRITIS.                           - Await pathology results. CHECK CBC TODAY.                           - Repeat colonoscopy is not recommended due to                            current age (16 years or older) for surveillance.                           - Return to GI office in 6 months WITH DR. Shmiel Morton. Procedure Code(s):        --- Professional ---                           [phone], Colonoscopy, flexible; with removal of                            tumor(s), polyp(s), or other lesion(s) by snare                            technique                           99153, Moderate sedation; each additional 15                            minutes intraservice time                           99153, Moderate sedation; each additional 15                            minutes intraservice time                           G0500, Moderate sedation services provided by the                            same physician or other qualified health care                            professional performing a gastrointestinal                             endoscopic service that sedation supports,                            requiring the presence of an independent trained                            observer to assist in the monitoring of the                            patient's level of  consciousness and physiological                            status; initial 15 minutes of intra-service time;                            patient age 19 years or older (additional time may                            be reported with [phone], as appropriate) Diagnosis Code(s):        --- Professional ---                           K64.8, Other hemorrhoids                           D12.3, Benign neoplasm of transverse colon (hepatic                            flexure or splenic flexure)                           R19.5, Other fecal abnormalities CPT copyright 2018 American Medical Association. All rights reserved. The codes documented in this report are preliminary and upon coder review may  be revised to meet current compliance requirements. Barney Drain, MD Barney Drain MD, MD 12/25/2018 10:13:45 AM This report has been signed electronically. Number of Addenda: 0

## 2018-12-25 NOTE — H&P (Addendum)
Primary Care Physician:  [name], MD Primary Gastroenterologist:  Dr. Oneida Alar  Pre-Procedure History & Physical: HPI:  Tricia Savage is a 73 y.o. female here for [place].  Past Medical History:  Diagnosis Date  . Atrial fibrillation ([place])    a. occurred in 2000, never placed on anti-coagulation. No known recurrence.   Marland Kitchen CAD (coronary artery disease)    a. cath 05/2016: 99% stenosis RI (DES placed), mild non-obstructive disease RCA, 70% along small caliber diagonal branch  . HLD (hyperlipidemia)    a. intoelrant to statin therapy  . Hypertension   . Trigeminal neuralgia     Past Surgical History:  Procedure Laterality Date  . ABDOMINAL HYSTERECTOMY    . CARDIAC CATHETERIZATION N/A 05/14/2016   Procedure: Left Heart Cath and Coronary Angiography;  Surgeon: Burnell Blanks, MD;  Location: [facility];  Service: Cardiovascular;  Laterality: N/A;  . CARDIAC CATHETERIZATION N/A 05/14/2016   Procedure: Coronary Stent Intervention;  Surgeon: Burnell Blanks, MD;  Location: [facility];  Service: Cardiovascular;  Laterality: N/A;  . 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     Prior to Admission medications   Medication Sig Start Date End Date Taking? Authorizing Provider  AMBULATORY NON FORMULARY MEDICATION Take 180 mg by mouth daily. Medication Name: bempedoic acid, CLEAR Research Study drug provided 09/09/16  Yes Lelon Perla, MD  aspirin EC 81 MG EC tablet Take 1 tablet (81 mg total) by mouth daily. 05/15/16  Yes Strader, [place], PA-C  ezetimibe (ZETIA) 10 MG tablet TAKE 1 TABLET BY MOUTH EVERY DAY 09/26/18  Yes Branch, Alphonse Guild, MD  hydrochlorothiazide (HYDRODIURIL) 25 MG tablet Take 12.5 mg by mouth daily.    Yes [provider]  metoprolol tartrate (LOPRESSOR) 25 MG tablet Take 25 mg by mouth 2 (two) times daily.   Yes [provider]  carbamazepine (CARBATROL) 200 MG 12 hr capsule Take 200 mg  by mouth 2 (two) times daily as needed (FACIAL PAIN).     [provider]  loratadine (CLARITIN) 10 MG tablet Take 10 mg by mouth daily as needed for allergies.    [provider]  nitroGLYCERIN (NITROSTAT) 0.4 MG SL tablet Place 0.4 mg under the tongue every 5 (five) minutes as needed for chest pain.    [provider]    Allergies as of 11/01/2018 - Review Complete 11/01/2018  Allergen Reaction Noted  . Bee venom Anaphylaxis and Swelling 05/31/2016  . Lisinopril Swelling 12/01/2017  . Atorvastatin  05/14/2016  . Codeine Nausea And Vomiting 05/14/2016  . Erythromycin  05/14/2016  . Milk-related compounds Other (See Comments) 05/31/2016  . Morphine and related Nausea And Vomiting 05/14/2016  . Niacin and related  05/14/2016  . Statins  05/14/2016    Family History  Problem Relation Age of Onset  . Heart Problems Mother   . Stroke Father   . Diabetes Father   . Heart attack Brother   . Heart attack Maternal Grandfather   . Heart attack Brother   . Colon cancer Neg Hx   . Colon polyps Neg Hx     Social History   Socioeconomic History  . Marital status: Divorced    Spouse name: Not on file  . Number of children: Not on file  . Years of education: Not on file  . Highest education level: Not on file  Occupational History  . Not on file  Social  Needs  . Financial resource strain: Not on file  . Food insecurity:    Worry: Not on file    Inability: Not on file  . Transportation needs:    Medical: Not on file    Non-medical: Not on file  Tobacco Use  . Smoking status: Never Smoker  . Smokeless tobacco: Never Used  Substance and Sexual Activity  . Alcohol use: No  . Drug use: No  . Sexual activity: Not Currently  Lifestyle  . Physical activity:    Days per week: Not on file    Minutes per session: Not on file  . Stress: Not on file  Relationships  . Social connections:    Talks on phone: Not on file    Gets together: Not on file     Attends religious service: Not on file    Active member of club or organization: Not on file    Attends meetings of clubs or organizations: Not on file    Relationship status: Not on file  . Intimate partner violence:    Fear of current or ex partner: Not on file    Emotionally abused: Not on file    Physically abused: Not on file    Forced sexual activity: Not on file  Other Topics Concern  . Not on file  Social History Narrative  . Not on file    Review of Systems: See HPI, otherwise negative ROS   Physical Exam: BP (!) 146/80   Pulse (!) 45   Temp (!) 97.5 F (36.4 C) (Oral)   Resp 12   Ht 5' 8.5" (1.74 m)   Wt 76.2 kg   SpO2 100%   BMI 25.17 kg/m  General:   Alert,  pleasant and cooperative in NAD Head:  Normocephalic and atraumatic. Neck:  Supple; Lungs:  Clear throughout to auscultation.    Heart:  Regular rate and rhythm. Abdomen:  Soft, nontender and nondistended. Normal bowel sounds, without guarding, and without rebound.   Neurologic:  Alert and  oriented x4;  grossly normal neurologically.  Impression/Plan:     HEME POS STOOLS  PLAN:  1.TCS TODAY. DISCUSSED PROCEDURE, BENEFITS, & RISKS: < 1% chance of medication reaction, bleeding, perforation, ASPIRATION, or rupture of spleen/liver requiring surgery to fix it and missed polyps < 1 cm 10-20% of the time.

## 2018-12-25 NOTE — Discharge Instructions (Signed)
THE BLOOD DETECTED IN YOUR STOOL IS MOST LIKELY DUE TO internal hemorrhoids OR GASTRITIS DUE TO ASPIRIN. You had 1 small polyp removed.   DRINK WATER TO KEEP YOUR URINE LIGHT YELLOW.  FOLLOW A HIGH FIBER DIET. AVOID ITEMS THAT CAUSE BLOATING & GAS. SEE INFO BELOW.   TO PREVENT ULCERS AND GASTRITIS DUE TO DAILY ASPIRIN USE, START OMEPRAZOLE.  TAKE 30 MINUTES PRIOR TO YOUR FIRST MEAL.  USE PREPARATION H FOUR TIMES  A DAY IF NEEDED TO RELIEVE RECTAL PAIN/PRESSURE/BLEEDING.   I AM CHECKING YOUR BLOOD COUNT TODAY.  YOUR BIOPSY RESULTS WILL BE BACK IN 5 BUSINESS DAYS.  FOLLOW UP IN 6 MOS WITH DR. Suvan Stcyr.  We do not routinely screen for polyps after the age of 47.   Colonoscopy Care After Read the instructions outlined below and refer to this sheet in the next week. These discharge instructions provide you with general information on caring for yourself after you leave the hospital. While your treatment has been planned according to the most current medical practices available, unavoidable complications occasionally occur. If you have any problems or questions after discharge, call DR. Paulina Muchmore, [phone].  ACTIVITY  You may resume your regular activity, but move at a slower pace for the next 24 hours.   Take frequent rest periods for the next 24 hours.   Walking will help get rid of the air and reduce the bloated feeling in your belly (abdomen).   No driving for 24 hours (because of the medicine (anesthesia) used during the test).   You may shower.   Do not sign any important legal documents or operate any machinery for 24 hours (because of the anesthesia used during the test).    NUTRITION  Drink plenty of fluids.   You may resume your normal diet as instructed by your doctor.   Begin with a light meal and progress to your normal diet. Heavy or fried foods are harder to digest and may make you feel sick to your stomach (nauseated).   Avoid alcoholic beverages for 24 hours  or as instructed.    MEDICATIONS  You may resume your normal medications.   WHAT YOU CAN EXPECT TODAY  Some feelings of bloating in the abdomen.   Passage of more gas than usual.   Spotting of blood in your stool or on the toilet paper  .  IF YOU HAD POLYPS REMOVED DURING THE COLONOSCOPY:  Eat a soft diet IF YOU HAVE NAUSEA, BLOATING, ABDOMINAL PAIN, OR VOMITING.    FINDING OUT THE RESULTS OF YOUR TEST Not all test results are available during your visit. DR. Oneida Alar WILL CALL YOU WITHIN 14 DAYS OF YOUR PROCEDUE WITH YOUR RESULTS. Do not assume everything is normal if you have not heard from DR. Zynasia Burklow, CALL HER OFFICE AT [phone].  SEEK IMMEDIATE MEDICAL ATTENTION AND CALL THE OFFICE: [phone] IF:  You have more than a spotting of blood in your stool.   Your belly is swollen (abdominal distention).   You are nauseated or vomiting.   You have a temperature over 101F.   You have abdominal pain or discomfort that is severe or gets worse throughout the day.   High-Fiber Diet A high-fiber diet changes your normal diet to include more whole grains, legumes, fruits, and vegetables. Changes in the diet involve replacing refined carbohydrates with unrefined foods. The calorie level of the diet is essentially unchanged. The Dietary Reference Intake (recommended amount) for adult males is 38 grams per day. For adult  females, it is 25 grams per day. Pregnant and lactating women should consume 28 grams of fiber per day. Fiber is the intact part of a plant that is not broken down during digestion. Functional fiber is fiber that has been isolated from the plant to provide a beneficial effect in the body.  PURPOSE  Increase stool bulk.   Ease and regulate bowel movements.   Lower cholesterol.   REDUCE RISK OF COLON CANCER  INDICATIONS THAT YOU NEED MORE FIBER  Constipation and hemorrhoids.   Uncomplicated diverticulosis (intestine condition) and irritable bowel  syndrome.   Weight management.   As a protective measure against hardening of the arteries (atherosclerosis), diabetes, and cancer.   GUIDELINES FOR INCREASING FIBER IN THE DIET  Start adding fiber to the diet slowly. A gradual increase of about 5 more grams (2 slices of whole-wheat bread, 2 servings of most fruits or vegetables, or 1 bowl of high-fiber cereal) per day is best. Too rapid an increase in fiber may result in constipation, flatulence, and bloating.   Drink enough water and fluids to keep your urine clear or pale yellow. Water, juice, or caffeine-free drinks are recommended. Not drinking enough fluid may cause constipation.   Eat a variety of high-fiber foods rather than one type of fiber.   Try to increase your intake of fiber through using high-fiber foods rather than fiber pills or supplements that contain small amounts of fiber.   The goal is to change the types of food eaten. Do not supplement your present diet with high-fiber foods, but replace foods in your present diet.   INCLUDE A VARIETY OF FIBER SOURCES  Replace refined and processed grains with whole grains, canned fruits with fresh fruits, and incorporate other fiber sources. White rice, white breads, and most bakery goods contain little or no fiber.   Brown whole-grain rice, buckwheat oats, and many fruits and vegetables are all good sources of fiber. These include: broccoli, Brussels sprouts, cabbage, cauliflower, beets, sweet potatoes, white potatoes (skin on), carrots, tomatoes, eggplant, squash, berries, fresh fruits, and dried fruits.   Cereals appear to be the richest source of fiber. Cereal fiber is found in whole grains and bran. Bran is the fiber-rich outer coat of cereal grain, which is largely removed in refining. In whole-grain cereals, the bran remains. In breakfast cereals, the largest amount of fiber is found in those with "bran" in their names. The fiber content is sometimes indicated on the label.     You may need to include additional fruits and vegetables each day.   In baking, for 1 cup white flour, you may use the following substitutions:   1 cup whole-wheat flour minus 2 tablespoons.   1/2 cup white flour plus 1/2 cup whole-wheat flour.   Polyps, Colon  A polyp is extra tissue that grows inside your body. Colon polyps grow in the large intestine. The large intestine, also called the colon, is part of your digestive system. It is a long, hollow tube at the end of your digestive tract where your body makes and stores stool. Most polyps are not dangerous. They are benign. This means they are not cancerous. But over time, some types of polyps can turn into cancer. Polyps that are smaller than a pea are usually not harmful. But larger polyps could someday become or may already be cancerous. To be safe, doctors remove all polyps and test them.   WHO GETS POLYPS? Anyone can get polyps, but certain people are more likely  than others. You may have a greater chance of getting polyps if:  You are over 50.   You have had polyps before.   Someone in your family has had polyps.   Someone in your family has had cancer of the large intestine.   Find out if someone in your family has had polyps. You may also be more likely to get polyps if you:   Eat a lot of fatty foods   Smoke   Drink alcohol   Do not exercise  Eat too much   PREVENTION There is not one sure way to prevent polyps. You might be able to lower your risk of getting them if you:  Eat more fruits and vegetables and less fatty food.   Do not smoke.   Avoid alcohol.   Exercise every day.   Lose weight if you are overweight.   Eating more calcium and folate can also lower your risk of getting polyps. Some foods that are rich in calcium are milk, cheese, and broccoli. Some foods that are rich in folate are chickpeas, kidney beans, and spinach.   Hemorrhoids Hemorrhoids are dilated (enlarged) veins around the rectum.  Sometimes clots will form in the veins. This makes them swollen and painful. These are called thrombosed hemorrhoids. Causes of hemorrhoids include:  Constipation.   Straining to have a bowel movement.   HEAVY LIFTING  HOME CARE INSTRUCTIONS  Eat a well balanced diet and drink 6 to 8 glasses of water every day to avoid constipation. You may also use a bulk laxative.   Avoid straining to have bowel movements.   Keep anal area dry and clean.   Do not use a donut shaped pillow or sit on the toilet for long periods. This increases blood pooling and pain.   Move your bowels when your body has the urge; this will require less straining and will decrease pain and pressure.

## 2018-12-26 NOTE — Progress Notes (Signed)
On recall  °

## 2018-12-26 NOTE — Progress Notes (Signed)
cc'd to pcp and on recall

## 2018-12-26 NOTE — Progress Notes (Signed)
Pt is aware.  

## 2018-12-27 ENCOUNTER — Encounter (HOSPITAL_COMMUNITY): Payer: Self-pay | Admitting: Gastroenterology

## 2019-01-19 ENCOUNTER — Telehealth: Payer: Self-pay | Admitting: *Deleted

## 2019-01-19 NOTE — Telephone Encounter (Signed)
Spoke with patient for visit [id number] in the Clear research study.  No cos, aes or saes to report. Did have a colonoscopy and added omeprazole 20mg .  Doing well and appreciative of call. Next clinic visit confirmed and had discussion regarding if clinic visit not available.

## 2019-01-23 DIAGNOSIS — J301 Allergic rhinitis due to pollen: Secondary | ICD-10-CM | POA: Diagnosis not present

## 2019-01-23 DIAGNOSIS — I10 Essential (primary) hypertension: Secondary | ICD-10-CM | POA: Diagnosis not present

## 2019-01-23 DIAGNOSIS — E782 Mixed hyperlipidemia: Secondary | ICD-10-CM | POA: Diagnosis not present

## 2019-01-23 DIAGNOSIS — Z7189 Other specified counseling: Secondary | ICD-10-CM | POA: Diagnosis not present

## 2019-04-09 ENCOUNTER — Telehealth: Payer: Self-pay | Admitting: *Deleted

## 2019-04-09 NOTE — Telephone Encounter (Signed)
Spoke with subject for visit [id number] in the Clear research study.  No cos, aes or saes to report.  Patient prefers drug be sent to her via courier and gave verbal permission to do so.  Instructed her to save all bottles and will be coming to the clinic for labs in a few weeks.

## 2019-04-27 DIAGNOSIS — L298 Other pruritus: Secondary | ICD-10-CM | POA: Diagnosis not present

## 2019-04-27 DIAGNOSIS — E782 Mixed hyperlipidemia: Secondary | ICD-10-CM | POA: Diagnosis not present

## 2019-04-27 DIAGNOSIS — J301 Allergic rhinitis due to pollen: Secondary | ICD-10-CM | POA: Diagnosis not present

## 2019-04-27 DIAGNOSIS — I2510 Atherosclerotic heart disease of native coronary artery without angina pectoris: Secondary | ICD-10-CM | POA: Diagnosis not present

## 2019-04-27 DIAGNOSIS — I10 Essential (primary) hypertension: Secondary | ICD-10-CM | POA: Diagnosis not present

## 2019-05-15 ENCOUNTER — Telehealth: Payer: Self-pay | Admitting: Cardiology

## 2019-05-15 NOTE — Telephone Encounter (Signed)
Virtual Visit Pre-Appointment Phone Call  "(Name), I am calling you today to discuss your upcoming appointment. We are currently trying to limit exposure to the virus that causes COVID-19 by seeing patients at home rather than in the office."  1. "What is the BEST phone number to call the day of the visit?" - include this in appointment notes  2. Do you have or have access to (through a family member/friend) a smartphone with video capability that we can use for your visit?" a. If yes - list this number in appt notes as cell (if different from BEST phone #) and list the appointment type as a VIDEO visit in appointment notes b. If no - list the appointment type as a PHONE visit in appointment notes  3. Confirm consent - "In the setting of the current Covid19 crisis, you are scheduled for a (phone or video) visit with your provider on (date) at (time).  Just as we do with many in-office visits, in order for you to participate in this visit, we must obtain consent.  If you'd like, I can send this to your mychart (if signed up) or email for you to review.  Otherwise, I can obtain your verbal consent now.  All virtual visits are billed to your insurance company just like a normal visit would be.  By agreeing to a virtual visit, we'd like you to understand that the technology does not allow for your provider to perform an examination, and thus may limit your provider's ability to fully assess your condition. If your provider identifies any concerns that need to be evaluated in person, we will make arrangements to do so.  Finally, though the technology is pretty good, we cannot assure that it will always work on either your or our end, and in the setting of a video visit, we may have to convert it to a phone-only visit.  In either situation, we cannot ensure that we have a secure connection.  Are you willing to proceed?" STAFF: Did the patient verbally acknowledge consent to telehealth visit? Document  YES/NO here: yes  4. Advise patient to be prepared - "Two hours prior to your appointment, go ahead and check your blood pressure, pulse, oxygen saturation, and your weight (if you have the equipment to check those) and write them all down. When your visit starts, your provider will ask you for this information. If you have an Apple Watch or Kardia device, please plan to have heart rate information ready on the day of your appointment. Please have a pen and paper handy nearby the day of the visit as well."  5. Give patient instructions for MyChart download to smartphone OR Doximity/[email or web address] as below if video visit (depending on what platform provider is using)  6. Inform patient they will receive a phone call 15 minutes prior to their appointment time (may be from unknown caller ID) so they should be prepared to answer    TELEPHONE CALL NOTE  SHENELLE KLAS has been deemed a candidate for a follow-up tele-health visit to limit community exposure during the Covid-19 pandemic. I spoke with the patient via phone to ensure availability of phone/video source, confirm preferred email & phone number, and discuss instructions and expectations.  I reminded Tricia Savage to be prepared with any vital sign and/or heart rhythm information that could potentially be obtained via home monitoring, at the time of her visit. I reminded Tricia Savage to expect a phone call prior to  her visit.  Weston Anna 05/15/2019 9:18 AM   INSTRUCTIONS FOR DOWNLOADING THE MYCHART APP TO SMARTPHONE  - The patient must first make sure to have activated MyChart and know their login information - If Apple, go to [employer] and type in MyChart in the search bar and download the app. If Android, ask patient to go to [employer] and type in [place] in the search bar and download the app. The app is free but as with any other app downloads, their phone may require them to verify saved payment information or Apple/Android  password.  - The patient will need to then log into the app with their MyChart username and password, and select [facility] as their healthcare provider to link the account. When it is time for your visit, go to the MyChart app, find appointments, and click Begin Video Visit. Be sure to Select Allow for your device to access the Microphone and Camera for your visit. You will then be connected, and your provider will be with you shortly.  **If they have any issues connecting, or need assistance please contact MyChart service desk [phone])**  **If using a computer, in order to ensure the best quality for their visit they will need to use either of the following Internet Browsers: [employer], or Google Chrome**  IF USING DOXIMITY or DOXY.ME - The patient will receive a link just prior to their visit by text.     FULL LENGTH CONSENT FOR TELE-HEALTH VISIT   I hereby voluntarily request, consent and authorize [facility] and its employed or contracted physicians, physician assistants, nurse practitioners or other licensed health care professionals (the Practitioner), to provide me with telemedicine health care services (the Services") as deemed necessary by the treating Practitioner. I acknowledge and consent to receive the Services by the Practitioner via telemedicine. I understand that the telemedicine visit will involve communicating with the Practitioner through live audiovisual communication technology and the disclosure of certain medical information by electronic transmission. I acknowledge that I have been given the opportunity to request an in-person assessment or other available alternative prior to the telemedicine visit and am voluntarily participating in the telemedicine visit.  I understand that I have the right to withhold or withdraw my consent to the use of telemedicine in the course of my care at any time, without affecting my right to future care or treatment,  and that the Practitioner or I may terminate the telemedicine visit at any time. I understand that I have the right to inspect all information obtained and/or recorded in the course of the telemedicine visit and may receive copies of available information for a reasonable fee.  I understand that some of the potential risks of receiving the Services via telemedicine include:   Delay or interruption in medical evaluation due to technological equipment failure or disruption;  Information transmitted may not be sufficient (e.g. poor resolution of images) to allow for appropriate medical decision making by the Practitioner; and/or   In rare instances, security protocols could fail, causing a breach of personal health information.  Furthermore, I acknowledge that it is my responsibility to provide information about my medical history, conditions and care that is complete and accurate to the best of my ability. I acknowledge that Practitioner's advice, recommendations, and/or decision may be based on factors not within their control, such as incomplete or inaccurate data provided by me or distortions of diagnostic images or specimens that may result from electronic transmissions. I  understand that the practice of medicine is not an exact science and that Practitioner makes no warranties or guarantees regarding treatment outcomes. I acknowledge that I will receive a copy of this consent concurrently upon execution via email to the email address I last provided but may also request a printed copy by calling the office of [place].    I understand that my insurance will be billed for this visit.   I have read or had this consent read to me.  I understand the contents of this consent, which adequately explains the benefits and risks of the Services being provided via telemedicine.   I have been provided ample opportunity to ask questions regarding this consent and the Services and have had my questions  answered to my satisfaction.  I give my informed consent for the services to be provided through the use of telemedicine in my medical care  By participating in this telemedicine visit I agree to the above.

## 2019-05-18 ENCOUNTER — Telehealth (INDEPENDENT_AMBULATORY_CARE_PROVIDER_SITE_OTHER): Payer: PPO | Admitting: Cardiology

## 2019-05-18 VITALS — BP 137/87 | HR 60 | Ht 68.0 in | Wt 177.0 lb

## 2019-05-18 DIAGNOSIS — I10 Essential (primary) hypertension: Secondary | ICD-10-CM

## 2019-05-18 DIAGNOSIS — I2510 Atherosclerotic heart disease of native coronary artery without angina pectoris: Secondary | ICD-10-CM

## 2019-05-18 DIAGNOSIS — E782 Mixed hyperlipidemia: Secondary | ICD-10-CM

## 2019-05-18 NOTE — Patient Instructions (Signed)
Medication Instructions:  Your physician recommends that you continue on your current medications as directed. Please refer to the Current Medication list given to you today.  If you need a refill on your cardiac medications before your next appointment, please call your pharmacy.   Lab work: NONE   If you have labs (blood work) drawn today and your tests are completely normal, you will receive your results only by: . MyChart Message (if you have MyChart) OR . A paper copy in the mail If you have any lab test that is abnormal or we need to change your treatment, we will call you to review the results.  Testing/Procedures: NONE   Follow-Up: At [facility], you and your health needs are our priority.  As part of our continuing mission to provide you with exceptional heart care, we have created designated Provider Care Teams.  These Care Teams include your primary Cardiologist (physician) and Advanced Practice Providers (APPs -  Physician Assistants and Nurse Practitioners) who all work together to provide you with the care you need, when you need it. You will need a follow up appointment in 6 months.  Please call our office 2 months in advance to schedule this appointment.  You may see Branch, Jonathan, MD or one of the following Advanced Practice Providers on your designated Care Team:   Brittany Strader, PA-C ([place] Office) . Michele Lenze, PA-C ([place] Office)  Any Other Special Instructions Will Be Listed Below (If Applicable). Thank you for choosing [facility]!     

## 2019-05-18 NOTE — Progress Notes (Signed)
Virtual Visit via Telephone Note   This visit type was conducted due to national recommendations for restrictions regarding the COVID-19 Pandemic (e.g. social distancing) in an effort to limit this patient's exposure and mitigate transmission in our community.  Due to her co-morbid illnesses, this patient is at least at moderate risk for complications without adequate follow up.  This format is felt to be most appropriate for this patient at this time.  The patient did not have access to video technology/had technical difficulties with video requiring transitioning to audio format only (telephone).  All issues noted in this document were discussed and addressed.  No physical exam could be performed with this format.  Please refer to the patient's chart for her  consent to telehealth for [facility].   Date:  05/18/2019   ID:  Tricia Savage, DOB May 01, 1946, MRN [id number]  Patient Location: Home Provider Location: Office  PCP:  Tricia Evens, MD  Cardiologist:  Tricia Dolly, MD  Electrophysiologist:  None   Evaluation Performed:  Follow-Up Visit  Chief Complaint:  Follow up visit  History of Present Illness:    Tricia Savage is a 73 y.o. female seen today for follow up of the following medical problems.  1. CAD - admit NSTEMI 05/2016, received DES to ramus. Echo at that time LVEF 55-60%, basalinferior hypokinesis.  - beta blocker limited due to bradycardia - lisinopirl stopped due to tongue swelling during ER visit 11/2017.   - no recent chest pain. Denies any SOB/DOE - compliant with meds.   2. Hyperlipidemia - statin intolerant. Started on zetia during 05/2016 admission. - part of CLEAR research study  - labs followed by pcp - compliant with meds  3. HTN - home bp's usually around 120s/70s - she reports pcp lowered meds prevoiusly due to low bp's.       The patient does not have symptoms concerning for COVID-19 infection (fever, chills, cough, or new  shortness of breath).    Past Medical History:  Diagnosis Date  . Atrial fibrillation ([place])    a. occurred in 2000, never placed on anti-coagulation. No known recurrence.   Marland Kitchen CAD (coronary artery disease)    a. cath 05/2016: 99% stenosis RI (DES placed), mild non-obstructive disease RCA, 70% along small caliber diagonal Casyn Becvar  . HLD (hyperlipidemia)    a. intoelrant to statin therapy  . Hypertension   . Trigeminal neuralgia    Past Surgical History:  Procedure Laterality Date  . ABDOMINAL HYSTERECTOMY    . CARDIAC CATHETERIZATION N/A 05/14/2016   Procedure: Left Heart Cath and Coronary Angiography;  Surgeon: Tricia Blanks, MD;  Location: [place] [facility];  Service: Cardiovascular;  Laterality: N/A;  . CARDIAC CATHETERIZATION N/A 05/14/2016   Procedure: Coronary Stent Intervention;  Surgeon: Tricia Blanks, MD;  Location: [place] [facility];  Service: Cardiovascular;  Laterality: N/A;  . COLONOSCOPY N/A 12/25/2018   Procedure: COLONOSCOPY;  Surgeon: Tricia Binder, MD;  [facility];  Service: Endoscopy;  Laterality: N/A;  2:15pm  . 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   Marland Kitchen POLYPECTOMY  12/25/2018   Procedure: POLYPECTOMY;  Surgeon: Tricia Binder, MD;  [facility];  Service: Endoscopy;;     Current Meds  Medication Sig  . AMBULATORY NON FORMULARY MEDICATION Take 180 mg by mouth daily. Medication Name: bempedoic acid, CLEAR Research Study drug provided  . aspirin EC 81 MG EC tablet Take 1  tablet (81 mg total) by mouth daily.  . carbamazepine (CARBATROL) 200 MG 12 hr capsule Take 200 mg by mouth 2 (two) times daily as needed (FACIAL PAIN).   Marland Kitchen ezetimibe (ZETIA) 10 MG tablet TAKE 1 TABLET BY MOUTH EVERY DAY  . hydrochlorothiazide (HYDRODIURIL) 25 MG tablet Take 12.5 mg by mouth daily.   Marland Kitchen loratadine (CLARITIN) 10 MG tablet Take 10 mg by mouth daily as needed for allergies.  . metoprolol tartrate (LOPRESSOR) 25 MG tablet  Take 25 mg by mouth 2 (two) times daily. Take 1 Tablet in the AM and 1/2 in the PM  . nitroGLYCERIN (NITROSTAT) 0.4 MG SL tablet Place 0.4 mg under the tongue every 5 (five) minutes as needed for chest pain.  Marland Kitchen omeprazole (PRILOSEC) 20 MG capsule 1 PO 30 MINS PRIOR TO BREAKFAST.  . [DISCONTINUED] metoprolol tartrate (LOPRESSOR) 25 MG tablet Take 25 mg by mouth 2 (two) times daily.     Allergies:   Bee venom, Lisinopril, Atorvastatin, Codeine, Erythromycin, Milk-related compounds, Morphine and related, Niacin and related, Other, and Statins   Social History   Tobacco Use  . Smoking status: Never Smoker  . Smokeless tobacco: Never Used  Substance Use Topics  . Alcohol use: No  . Drug use: No     Family Hx: The patient's family history includes Diabetes in her father; Heart Problems in her mother; Heart attack in her brother, brother, and maternal grandfather; Stroke in her father. There is no history of Colon cancer or Colon polyps.  ROS:   Please see the history of present illness.     All other systems reviewed and are negative.   Prior CV studies:   The following studies were reviewed today:  05/2016 cath   Prox RCA to Mid RCA lesion, 40 %stenosed.  Ost Ramus lesion, 30 %stenosed.  A STENT PROMUS PREM MR 2.25X16 drug eluting stent was successfully placed.  Ramus lesion, 99 %stenosed.  Post intervention, there is a 0% residual stenosis.  1st Diag lesion, 70 %stenosed.  The left ventricular systolic function is normal.  LV end diastolic pressure is normal.  The left ventricular ejection fraction is 55-65% by visual estimate.  There is no mitral valve regurgitation.  1. Severe stenosis mid Ramus intermediate Clayten Allcock, culprit for NSTEMI 2. Mild non-obstructive disease in the RCA.  3. Moderate stenosis small caliber Diagonal Tricia Savage 4. Normal LV systolic function 5. Successful PTCA/DES x 1 mid Ramus intermediate Tricia Savage  Recommendations: Continue DAPT with ASA  and Brilinta for one year. She is statin intolerant. Consider PCSK9-inh after discharge. Start beta blocker. Follow up in [facility] office.   Labs/Other Tests and Data Reviewed:    EKG:  No ECG reviewed.  Recent Labs: 12/25/2018: Hemoglobin 12.5; Platelets 210   Recent Lipid Panel Lab Results  Component Value Date/Time   CHOL 245 (H) 08/13/2016 09:34 AM   TRIG 167 (H) 08/13/2016 09:34 AM   HDL 40 (L) 08/13/2016 09:34 AM   CHOLHDL 6.1 (H) 08/13/2016 09:34 AM   LDLCALC 172 (H) 08/13/2016 09:34 AM    Wt Readings from Last 3 Encounters:  05/18/19 177 lb (80.3 kg)  12/25/18 168 lb (76.2 kg)  11/09/18 169 lb 6.4 oz (76.8 kg)     Objective:    Vital Signs:  BP 137/87   Pulse 60   Ht 5\' 8"  (1.727 m)   Wt 177 lb (80.3 kg)   BMI 26.91 kg/m    Normal affect. Normal speech pattern and tone. Comfortable,  no apparent distress. No audible signs of SOB or wheezing.   ASSESSMENT & PLAN:    1. CAD - denies any symptoms, continue current meds - ACE allergy with angioedema, have not tried ARB due to risk of cross reactivity.     2. Hyperlipidemia - intolerant to statins. - she will continue zetia, she is also part of the CLEAR study  3. HTN - reported home bp trends are at goal, cotinue current meds  COVID-19 Education: The signs and symptoms of COVID-19 were discussed with the patient and how to seek care for testing (follow up with PCP or arrange E-visit).  The importance of social distancing was discussed today.  Time:   Today, I have spent 14 minutes with the patient with telehealth technology discussing the above problems.     Medication Adjustments/Labs and Tests Ordered: Current medicines are reviewed at length with the patient today.  Concerns regarding medicines are outlined above.   Tests Ordered: No orders of the defined types were placed in this encounter.   Medication Changes: No orders of the defined types were placed in this encounter.    Follow Up:  In Person in 6 month(s)  Signed, Tricia Dolly, MD  05/18/2019 11:01 AM    [place]

## 2019-06-05 ENCOUNTER — Encounter: Payer: Self-pay | Admitting: Gastroenterology

## 2019-07-12 ENCOUNTER — Other Ambulatory Visit: Payer: Self-pay

## 2019-07-12 ENCOUNTER — Ambulatory Visit (INDEPENDENT_AMBULATORY_CARE_PROVIDER_SITE_OTHER): Payer: PPO | Admitting: Gastroenterology

## 2019-07-12 ENCOUNTER — Encounter: Payer: Self-pay | Admitting: Gastroenterology

## 2019-07-12 DIAGNOSIS — Z1159 Encounter for screening for other viral diseases: Secondary | ICD-10-CM | POA: Diagnosis not present

## 2019-07-12 DIAGNOSIS — R195 Other fecal abnormalities: Secondary | ICD-10-CM

## 2019-07-12 NOTE — Progress Notes (Signed)
Subjective:    Patient ID: Tricia Savage, female    DOB: 07/06/1946, 73 y.o.   MRN: [id number]  Tricia Evens, MD   HPI DOING FINE. BOWEL MOVING GOOD. SOB IF SHE OVERDOES IT SINCE STARTING BRILINTA AND HEART ATTACK. STOPPED MOVING WHILE MAKING COVID MASKS AND NOW DOING BETTER. Problems with sedation: SLEPT FOR A LONG TIME AFTER SEDATION (Z4/DEM 50/V1), LAST NTG 2017. FLU SHOT UTD.  PT DENIES FEVER, CHILLS, HEMATOCHEZIA, HEMATEMESIS, nausea, vomiting, melena, diarrhea, CHEST PAIN, CHANGE IN BOWEL IN HABITS, constipation, abdominal pain, problems swallowing, OR heartburn or indigestion.  Past Medical History:  Diagnosis Date  . Atrial fibrillation ([place])    a. occurred in 2000, never placed on anti-coagulation. No known recurrence.   Marland Kitchen CAD (coronary artery disease)    a. cath 05/2016: 99% stenosis RI (DES placed), mild non-obstructive disease RCA, 70% along small caliber diagonal branch  . HLD (hyperlipidemia)    a. intoelrant to statin therapy  . Hypertension   . Trigeminal neuralgia    Past Surgical History:  Procedure Laterality Date  . ABDOMINAL HYSTERECTOMY    . CARDIAC CATHETERIZATION N/A 05/14/2016   Procedure: Left Heart Cath and Coronary Angiography;  Surgeon: Burnell Blanks, MD;  Location: [place] [facility];  Service: Cardiovascular;  Laterality: N/A;  . CARDIAC CATHETERIZATION N/A 05/14/2016   Procedure: Coronary Stent Intervention;  Surgeon: Burnell Blanks, MD;  Location: [place] [facility];  Service: Cardiovascular;  Laterality: N/A;  . CHOLECYSTECTOMY     AGE 19  . COLONOSCOPY N/A 12/25/2018   Procedure: COLONOSCOPY;  Surgeon: Danie Binder, MD;  [facility];  Service: Endoscopy;  Laterality: N/A;  2:15pm  . 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   Marland Kitchen POLYPECTOMY  12/25/2018   Procedure: POLYPECTOMY;  Surgeon: Danie Binder, MD;  [facility];  Service: Endoscopy;;    Allergies  Allergen Reactions  .  Bee Venom Anaphylaxis and Swelling  . Lisinopril Swelling  . Atorvastatin   . Codeine Nausea And Vomiting  . Erythromycin   . Milk-Related Compounds Other (See Comments)    Rash in inside of mouth , digestive issues  . Morphine And Related Nausea And Vomiting  . Niacin And Related   . Other     PT CAN NOT BE AROUND HALLS COUGH DROPS -  IT TAKES HER BREATH AWAY   . Statins     Current Outpatient Medications  Medication Sig    . AMBULATORY NON FORMULARY MEDICATION Take 180 mg by mouth daily. Medication Name: bempedoic acid, CLEAR Research Study drug provided    . aspirin EC 81 MG EC tablet Take 1 tablet (81 mg total) by mouth daily.    . carbamazepine (CARBATROL) 200 MG 12 hr capsule Take 200 mg by mouth 2 (two) times daily as needed (FACIAL PAIN).     Marland Kitchen ezetimibe (ZETIA) 10 MG tablet TAKE 1 TABLET BY MOUTH EVERY DAY    . hydrochlorothiazide (HYDRODIURIL) 25 MG tablet Take 12.5 mg by mouth daily.     Marland Kitchen loratadine (CLARITIN) 10 MG tablet Take 10 mg by mouth daily as needed for allergies.    . metoprolol tartrate (LOPRESSOR) 25 MG tablet Take 25 mg by mouth 2 (two) times daily. Take 1 Tablet in the AM and 1/2 in the PM    . omeprazole (PRILOSEC) 20 MG capsule 1 PO 30 MINS PRIOR TO BREAKFAST.    Marland Kitchen nitroGLYCERIN (NITROSTAT)  0.4 MG SL tablet Place 0.4 mg under the tongue every 5 (five) minutes as needed for chest pain.     Review of Systems PER HPI OTHERWISE ALL SYSTEMS ARE NEGATIVE.    Objective:   Physical Exam Vitals signs reviewed.  Constitutional:      General: She is not in acute distress.    Appearance: She is well-developed.  HENT:     Head: Normocephalic and atraumatic.     Mouth/Throat:     Comments: MASK IN PLACE Eyes:     General: No scleral icterus.    Pupils: Pupils are equal, round, and reactive to light.  Neck:     Musculoskeletal: Normal range of motion and neck supple.  Cardiovascular:     Rate and Rhythm: Normal rate and regular rhythm.     Heart sounds:  Normal heart sounds.  Pulmonary:     Effort: Pulmonary effort is normal. No respiratory distress.     Breath sounds: Normal breath sounds.  Abdominal:     General: Bowel sounds are normal. There is no distension.     Palpations: Abdomen is soft.     Tenderness: There is no abdominal tenderness.  Musculoskeletal:     Right lower leg: No edema.     Left lower leg: No edema.  Lymphadenopathy:     Cervical: No cervical adenopathy.  Skin:    General: Skin is warm and dry.  Neurological:     Mental Status: She is alert and oriented to person, place, and time.     Comments: NO  NEW FOCAL DEFICITS  Psychiatric:        Mood and Affect: Mood normal.     Comments: NORMAL AFFECT       Assessment & Plan:

## 2019-07-12 NOTE — Assessment & Plan Note (Addendum)
NO WARNING SIGNS/SYMPTOMS AND LIKELY RELATED TO GASTRITIS.  CONTINUE OMEPRAZOLE.  TAKE 30 MINUTES PRIOR TO YOUR FIRST MEAL.  I RECOMMEND VEGAN SUPPLEMENTS.   EAT TO LIVE AND THINK OF FOOD AS MEDICINE. 75% OF YOUR PLATE SHOULD BE FRUITS/VEGGIES. To have more energy, and to lose FAT AROUND YOUR WAIST:      1. I RECOMMEND YOU READ AND FOLLOW RECOMMENDATIONS BY DR. MARK HYMAN, "10-DAY DETOX DIET".   2. If you must eat bread, EAT EZEKIEL BREAD. IT IS IN THE FROZEN SECTION OF THE GROCERY STORE.   3. DRINK WATER WITH FRUIT OR CUCUMBER ADDED. YOUR URINE SHOULD BE LIGHT YELLOW. AVOID SODA, GATORADE, ENERGY DRINKS, OR DIET SODA.    4. AVOID HIGH FRUCTOSE CORN SYRUP AND CAFFEINE.    5. DO NOT chew SUGAR FREE GUM OR USE ARTIFICIAL SWEETENERS. IF NEEDED USE STEVIA AS A SWEETENER.   6. DO NOT EAT ENRICHED WHEAT FLOUR, PASTA, RICE, OR CEREAL.   7. ONLY EAT WILD CAUGHT SEAFOOD, GRASS FED BEEF OR CHICKEN, PORK FROM PASTURE RAISE PIGS, OR EGGS FROM PASTURE RAISED CHICKENS.   8. PRACTICE CHAIR YOGA FOR 15-30 MINS 3 OR 4 TIMES A WEEK AND PROGRESS TO HATHA YOGA OVER NEXT 6 MOS.   9. START TAKING A MULTIVITAMIN, VITAMIN B12, AND VITAMIN D3 2000 IU DAILY.  ADDITIONAL SUPPLEMENTS TO DECREASE CRAVING AND SUPPRESS YOUR APPETITE:    1. CINNAMON 500 MG EVERY AM PRIOR TO FIRST MEAL.   **STABILIZES BLOOD GLUCOSE**   2. CHROMIUM 400-500 MG WITH MEALS TWICE DAILY.    **FAT BURNER**   3. GREEN TEA EXTRACT ONE DAILY.   **SUPPRESSES YOUR APPETITE**

## 2019-07-12 NOTE — Patient Instructions (Addendum)
CONTINUE OMEPRAZOLE.  TAKE 30 MINUTES PRIOR TO YOUR FIRST MEAL  COMPLETE LABS. YOUR RESULTS WILL BE BACK IN 5 BUSINESS DAYS.   EAT TO LIVE AND THINK OF FOOD AS MEDICINE. 75% OF YOUR PLATE SHOULD BE FRUITS/VEGGIES.  To have more energy, and to lose FAT AROUND YOUR WAIST:      1. I RECOMMEND YOU READ AND FOLLOW RECOMMENDATIONS BY DR. MARK HYMAN, "10-DAY DETOX DIET".    2. If you must eat bread, EAT EZEKIEL BREAD. IT IS IN THE FROZEN SECTION OF THE GROCERY STORE.    3. DRINK WATER WITH FRUIT OR CUCUMBER ADDED. YOUR URINE SHOULD BE LIGHT YELLOW. AVOID SODA, GATORADE, ENERGY DRINKS, OR DIET SODA.     4. AVOID HIGH FRUCTOSE CORN SYRUP AND CAFFEINE.     5. DO NOT chew SUGAR FREE GUM OR USE ARTIFICIAL SWEETENERS. IF NEEDED USE STEVIA AS A SWEETENER.    6. DO NOT EAT ENRICHED WHEAT FLOUR, PASTA, RICE, OR CEREAL.    7. ONLY EAT WILD CAUGHT SEAFOOD, GRASS FED BEEF OR CHICKEN, PORK FROM PASTURE RAISE PIGS, OR EGGS FROM PASTURE RAISED CHICKENS.    8. PRACTICE CHAIR YOGA FOR 15-30 MINS 3 OR 4 TIMES A WEEK AND PROGRESS TO HATHA YOGA OVER NEXT 6 MOS.    9. START TAKING A MULTIVITAMIN, VITAMIN B12, AND VITAMIN D3 2000 IU DAILY.   ADDITIONAL SUPPLEMENTS TO DECREASE CRAVING AND SUPPRESS YOUR APPETITE:    1. CINNAMON 500 MG EVERY AM PRIOR TO FIRST MEAL.   **STABILIZES BLOOD GLUCOSE**    2. CHROMIUM 400-500 MG WITH MEALS TWICE DAILY.    **FAT BURNER**    3. GREEN TEA EXTRACT ONE DAILY.   **SUPPRESSES YOUR APPETITE**

## 2019-07-12 NOTE — Assessment & Plan Note (Signed)
CHECK HEP C AB.

## 2019-07-13 LAB — CBC WITH DIFFERENTIAL/PLATELET
Absolute Monocytes: 470 cells/uL (ref 200–950)
Basophils Absolute: 59 cells/uL (ref 0–200)
Basophils Relative: 1.1 %
Eosinophils Absolute: 211 cells/uL (ref 15–500)
Eosinophils Relative: 3.9 %
HCT: 36.5 % (ref 35.0–45.0)
Hemoglobin: 12.2 g/dL (ref 11.7–15.5)
Lymphs Abs: 1782 cells/uL (ref 850–3900)
MCH: 30.5 pg (ref 27.0–33.0)
MCHC: 33.4 g/dL (ref 32.0–36.0)
MCV: 91.3 fL (ref 80.0–100.0)
MPV: 11.2 fL (ref 7.5–12.5)
Monocytes Relative: 8.7 %
Neutro Abs: 2878 cells/uL (ref 1500–7800)
Neutrophils Relative %: 53.3 %
Platelets: 355 10*3/uL (ref 140–400)
RBC: 4 10*6/uL (ref 3.80–5.10)
RDW: 12.1 % (ref 11.0–15.0)
Total Lymphocyte: 33 %
WBC: 5.4 10*3/uL (ref 3.8–10.8)

## 2019-07-13 LAB — HEPATITIS C ANTIBODY
Hepatitis C Ab: NONREACTIVE
SIGNAL TO CUT-OFF: 0.04 (ref <1.00)

## 2019-07-16 NOTE — Progress Notes (Signed)
CC'D TO PCP °

## 2019-07-24 ENCOUNTER — Telehealth: Payer: Self-pay | Admitting: *Deleted

## 2019-07-24 DIAGNOSIS — Z006 Encounter for examination for normal comparison and control in clinical research program: Secondary | ICD-10-CM

## 2019-07-24 NOTE — Telephone Encounter (Signed)
Spoke with patient for visit [id number] in the clear research study.  No aes or saes to report.  Next clinic visit sheduled.

## 2019-08-27 DIAGNOSIS — E782 Mixed hyperlipidemia: Secondary | ICD-10-CM | POA: Diagnosis not present

## 2019-08-27 DIAGNOSIS — I2510 Atherosclerotic heart disease of native coronary artery without angina pectoris: Secondary | ICD-10-CM | POA: Diagnosis not present

## 2019-08-27 DIAGNOSIS — J301 Allergic rhinitis due to pollen: Secondary | ICD-10-CM | POA: Diagnosis not present

## 2019-08-27 DIAGNOSIS — I10 Essential (primary) hypertension: Secondary | ICD-10-CM | POA: Diagnosis not present

## 2019-10-09 ENCOUNTER — Other Ambulatory Visit: Payer: Self-pay

## 2019-10-09 ENCOUNTER — Encounter: Payer: PPO | Admitting: *Deleted

## 2019-10-09 VITALS — BP 168/87 | HR 61 | Resp 98 | Wt 178.4 lb

## 2019-10-09 DIAGNOSIS — Z006 Encounter for examination for normal comparison and control in clinical research program: Secondary | ICD-10-CM

## 2019-10-09 NOTE — Research (Signed)
Patient to research clinic for visit [phone] in the clear research study.  No aes or saes to report.  New drug dispensed.  Next phone visit and clinic visit scheduled.

## 2019-10-22 ENCOUNTER — Other Ambulatory Visit: Payer: Self-pay

## 2019-10-22 MED ORDER — EZETIMIBE 10 MG PO TABS: 10.0000 mg | ORAL_TABLET | Freq: Every day | ORAL | 2 refills | Status: DC

## 2019-11-12 ENCOUNTER — Other Ambulatory Visit: Payer: Self-pay

## 2019-11-12 ENCOUNTER — Ambulatory Visit: Payer: PPO | Attending: Internal Medicine

## 2019-11-12 DIAGNOSIS — Z20822 Contact with and (suspected) exposure to covid-19: Secondary | ICD-10-CM | POA: Diagnosis not present

## 2019-11-13 LAB — NOVEL CORONAVIRUS, NAA: SARS-CoV-2, NAA: NOT DETECTED

## 2019-12-12 DIAGNOSIS — I252 Old myocardial infarction: Secondary | ICD-10-CM | POA: Diagnosis not present

## 2019-12-12 DIAGNOSIS — I10 Essential (primary) hypertension: Secondary | ICD-10-CM | POA: Diagnosis not present

## 2019-12-12 DIAGNOSIS — E782 Mixed hyperlipidemia: Secondary | ICD-10-CM | POA: Diagnosis not present

## 2019-12-12 DIAGNOSIS — Z7189 Other specified counseling: Secondary | ICD-10-CM | POA: Diagnosis not present

## 2019-12-27 ENCOUNTER — Ambulatory Visit: Payer: PPO | Attending: Internal Medicine

## 2019-12-27 DIAGNOSIS — Z23 Encounter for immunization: Secondary | ICD-10-CM

## 2019-12-27 NOTE — Progress Notes (Signed)
   Covid-19 Vaccination Clinic  Name:  Tricia Savage    MRN: [id number] DOB: 01/26/1946  12/27/2019  [name] was observed post Covid-19 immunization for 15 minutes without incident. She was provided with Vaccine Information Sheet and instruction to access the V-Safe system.   [name] was instructed to call 911 with any severe reactions post vaccine: Marland Kitchen Difficulty breathing  . Swelling of face and throat  . A fast heartbeat  . A bad rash all over body  . Dizziness and weakness   Immunizations Administered    Name Date Dose VIS Date Route   Moderna COVID-19 Vaccine 12/27/2019  1:28 PM 0.5 mL 09/04/2019 Intramuscular   Manufacturer: Moderna   Lot: [id number]   [id number]

## 2019-12-30 ENCOUNTER — Other Ambulatory Visit: Payer: Self-pay | Admitting: Gastroenterology

## 2019-12-31 NOTE — Progress Notes (Addendum)
Cardiology Office Note  Date: 01/01/2020   ID: Ramonita, Cherek 01-09-1946, MRN [id number]  PCP:  [name], MD  Cardiologist:  Carlyle Dolly, MD Electrophysiologist:  None   Chief Complaint: Follow-up CAD, HLD, HTN.  History of Present Illness: Tricia Savage is a 74 y.o. female with a history of CAD status post NSTEMI August 2017 with DES to ramus, echo with EF of 55 to 60%, basal inferior hypokinesis.  History of HLD, HTN.  At last encounter via telemedicine May 18, 2019 with Dr. Harl Bowie, patient denied any anginal or exertional symptoms.  Her ACE inhibitor was stopped secondary to angioedema.  She was continuing Zetia due to intolerance to statins. Her blood pressure was stable at home.  Patient states she has been doing well since last visit in August 2020.  Denies any recent acute illnesses, hospitalizations, pending surgeries, or travels.  Denies any anginal or exertional symptoms.  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  Denies any stroke or TIA-like symptoms, orthostatic symptoms, palpitations or arrhythmias, bleeding issues.  Denies any claudication-like symptoms, DVT or PE-like symptoms, or lower extremity edema.  Her last LDL was 172   Past Medical History:  Diagnosis Date  . Atrial fibrillation ([place])    a. occurred in 2000, never placed on anti-coagulation. No known recurrence.   Marland Kitchen CAD (coronary artery disease)    a. cath 05/2016: 99% stenosis RI (DES placed), mild non-obstructive disease RCA, 70% along small caliber diagonal branch  . HLD (hyperlipidemia)    a. intoelrant to statin therapy  . Hypertension   . Trigeminal neuralgia     Past Surgical History:  Procedure Laterality Date  . ABDOMINAL HYSTERECTOMY    . CARDIAC CATHETERIZATION N/A 05/14/2016   Procedure: Left Heart Cath and Coronary Angiography;  Surgeon: Burnell Blanks, MD;  Location: [facility];  Service: Cardiovascular;  Laterality: N/A;  .  CARDIAC CATHETERIZATION N/A 05/14/2016   Procedure: Coronary Stent Intervention;  Surgeon: Burnell Blanks, MD;  Location: [facility];  Service: Cardiovascular;  Laterality: N/A;  . CHOLECYSTECTOMY     AGE 46  . COLONOSCOPY N/A 12/25/2018   Procedure: COLONOSCOPY;  Surgeon: Danie Binder, MD;  [facility];  Service: Endoscopy;  Laterality: N/A;  2:15pm  . 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   Marland Kitchen POLYPECTOMY  12/25/2018   Procedure: POLYPECTOMY;  Surgeon: Danie Binder, MD;  [facility];  Service: Endoscopy;;    Current Outpatient Medications  Medication Sig Dispense Refill  . AMBULATORY NON FORMULARY MEDICATION Take 180 mg by mouth daily. Medication Name: bempedoic acid, CLEAR Research Study drug provided    . aspirin EC 81 MG EC tablet Take 1 tablet (81 mg total) by mouth daily.    . carbamazepine (CARBATROL) 200 MG 12 hr capsule Take 200 mg by mouth 2 (two) times daily as needed (FACIAL PAIN).     Marland Kitchen ezetimibe (ZETIA) 10 MG tablet Take 1 tablet (10 mg total) by mouth daily. 90 tablet 2  . hydrochlorothiazide (HYDRODIURIL) 25 MG tablet Take 12.5 mg by mouth daily.     Marland Kitchen loratadine (CLARITIN) 10 MG tablet Take 10 mg by mouth daily as needed for allergies.    . metoprolol tartrate (LOPRESSOR) 25 MG tablet Take 25 mg by mouth 2 (two) times daily. Take 1 Tablet in the AM and 1/2 in the PM    .  nitroGLYCERIN (NITROSTAT) 0.4 MG SL tablet Place 0.4 mg under the tongue every 5 (five) minutes as needed for chest pain.    Marland Kitchen omeprazole (PRILOSEC) 20 MG capsule 1 PO 30 MINS PRIOR TO BREAKFAST. 90 capsule 3   No current facility-administered medications for this visit.   Allergies:  Bee venom, Lisinopril, Atorvastatin, Codeine, Erythromycin, Milk-related compounds, Morphine and related, Niacin and related, Other, and Statins   Social History: The patient  reports that she has never smoked. She has never used smokeless tobacco. She reports  that she does not drink alcohol or use drugs.   Family History: The patient's family history includes Diabetes in her father; Heart Problems in her mother; Heart attack in her brother, brother, and maternal grandfather; Stroke in her father.   ROS:  Please see the history of present illness. Otherwise, complete review of systems is positive for none.  All other systems are reviewed and negative.    Physical Exam: VS:  BP 138/84   Pulse 72   Ht 5' 8.5" (1.74 m)   Wt 176 lb (79.8 kg)   SpO2 98%   BMI 26.37 kg/m , BMI Body mass index is 26.37 kg/m.  Wt Readings from Last 3 Encounters:  01/01/20 176 lb (79.8 kg)  10/09/19 178 lb 6.4 oz (80.9 kg)  07/12/19 174 lb 9.6 oz (79.2 kg)    General: Patient appears comfortable at rest. HEENT: Conjunctiva and lids normal, oropharynx clear with moist mucosa. Neck: Supple, no elevated JVP or carotid bruits, no thyromegaly. Lungs: Clear to auscultation, nonlabored breathing at rest. Cardiac: Regular rate and rhythm, no S3 or significant systolic murmur, no pericardial rub. Abdomen: Soft, nontender, no hepatomegaly, bowel sounds present, no guarding or rebound. Extremities: No pitting edema, distal pulses 2+. Skin: Warm and dry. Musculoskeletal: No kyphosis. Neuropsychiatric: Alert and oriented x3, affect grossly appropriate.  ECG: 01/01/2020.  EKG today shows normal sinus rhythm rate of 66 with first-degree AV block PR interval of 206 ms.  ST and T wave abnormality, consider inferior ischemia  Recent Labwork: 07/12/2019: Hemoglobin 12.2; Platelets 355     Component Value Date/Time   CHOL 245 (H) 08/13/2016 0934   TRIG 167 (H) 08/13/2016 0934   HDL 40 (L) 08/13/2016 0934   CHOLHDL 6.1 (H) 08/13/2016 0934   VLDL 33 (H) 08/13/2016 0934   LDLCALC 172 (H) 08/13/2016 0934    Other Studies Reviewed Today:  05/2016 cath   Prox RCA to Mid RCA lesion, 40 %stenosed.  Ost Ramus lesion, 30 %stenosed.  A STENT PROMUS PREM MR 2.25X16 drug  eluting stent was successfully placed.  Ramus lesion, 99 %stenosed.  Post intervention, there is a 0% residual stenosis.  1st Diag lesion, 70 %stenosed.  The left ventricular systolic function is normal.  LV end diastolic pressure is normal.  The left ventricular ejection fraction is 55-65% by visual estimate.  There is no mitral valve regurgitation.  1. Severe stenosis mid Ramus intermediate branch, culprit for NSTEMI 2. Mild non-obstructive disease in the RCA.  3. Moderate stenosis small caliber Diagonal branch 4. Normal LV systolic function 5. Successful PTCA/DES x 1 mid Ramus intermediate branch  Recommendations: Continue DAPT with ASA and Brilinta for one year. She is statin intolerant. Consider PCSK9-inh after discharge. Start beta blocker.  Diagnostic Dominance: Right  Intervention     Assessment and Plan:  1. CAD in native artery   2. Mixed hyperlipidemia   3. Essential hypertension    1. CAD in native artery Status post  NSTEMI with cardiac catheterization August 2017 proximal to mid RCA lesion 40%, ostial ramus lesion 30% ramus lesion 99% stenosed with stent Promus Premier 2.25 x 16 DES successfully placed.  First diagonal 70%.  Patient denies any progressive anginal or exertional symptoms.  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  She is very active on a daily basis.  Continue aspirin 81 mg, metoprolol 25 mg p.o. twice daily.  Continue sublingual nitroglycerin as needed.  2. Mixed hyperlipidemia  Intolerant to statins.  Currently on Zetia 10 mg daily.  Last known LDL lab work showed an LDL of 172.  Offered patient PCSK9 inhibitor.  Patient refuses therapy stating she has multiple allergies and is afraid she may be allergic to this medication.  She states she also does not like being stuck by a needle.  She needs a refill on her Zetia.  Please refill Zetia 10 mg daily.    3. Essential hypertension Blood pressure is 138/84 today.   Continue hydrochlorothiazid 12.5 mg daily.  Patient had to discontinue ACE inhibitor due to angioedema in the past.  States her blood pressures are usually 120s to 130s over 70s at home.  Medication Adjustments/Labs and Tests Ordered: Current medicines are reviewed at length with the patient today.  Concerns regarding medicines are outlined above.   Disposition: Follow-up with Dr. Harl Bowie or APP 6 months  Signed, Levell July, NP 01/01/2020 3:49 PM    [facility] [place], [place], [place] [address] Phone: [phone]; Fax: [phone]

## 2020-01-01 ENCOUNTER — Ambulatory Visit: Payer: PPO | Admitting: Family Medicine

## 2020-01-01 ENCOUNTER — Encounter: Payer: Self-pay | Admitting: Family Medicine

## 2020-01-01 ENCOUNTER — Other Ambulatory Visit: Payer: Self-pay

## 2020-01-01 VITALS — BP 138/84 | HR 72 | Ht 68.5 in | Wt 176.0 lb

## 2020-01-01 DIAGNOSIS — I2510 Atherosclerotic heart disease of native coronary artery without angina pectoris: Secondary | ICD-10-CM | POA: Diagnosis not present

## 2020-01-01 DIAGNOSIS — I10 Essential (primary) hypertension: Secondary | ICD-10-CM | POA: Diagnosis not present

## 2020-01-01 DIAGNOSIS — E782 Mixed hyperlipidemia: Secondary | ICD-10-CM

## 2020-01-01 MED ORDER — EZETIMIBE 10 MG PO TABS: 10.0000 mg | ORAL_TABLET | Freq: Every day | ORAL | 2 refills | Status: DC

## 2020-01-01 NOTE — Patient Instructions (Signed)

## 2020-01-24 ENCOUNTER — Ambulatory Visit: Payer: PPO | Attending: Internal Medicine

## 2020-01-24 DIAGNOSIS — Z23 Encounter for immunization: Secondary | ICD-10-CM

## 2020-01-24 NOTE — Progress Notes (Signed)
   Covid-19 Vaccination Clinic  Name:  Tricia Savage    MRN: [id number] DOB: 20-Oct-1945  01/24/2020  [name] was observed post Covid-19 immunization for 30 minutes based on pre-vaccination screening without incident. She was provided with Vaccine Information Sheet and instruction to access the V-Safe system.   [name] was instructed to call 911 with any severe reactions post vaccine: Marland Kitchen Difficulty breathing  . Swelling of face and throat  . A fast heartbeat  . A bad rash all over body  . Dizziness and weakness   Immunizations Administered    Name Date Dose VIS Date Route   Moderna COVID-19 Vaccine 01/24/2020 10:08 AM 0.5 mL 09/2019 Intramuscular   Manufacturer: Moderna   Lot: [id number]   [id number]

## 2020-01-24 NOTE — Progress Notes (Signed)
   Covid-19 Vaccination Clinic  Name:  Tricia Savage    MRN: [id number] DOB: 1946/07/17  01/24/2020  [name] was observed post Covid-19 immunization for 15 minutes without incident. She was provided with Vaccine Information Sheet and instruction to access the V-Safe system.   [name] was instructed to call 911 with any severe reactions post vaccine: Marland Kitchen Difficulty breathing  . Swelling of face and throat  . A fast heartbeat  . A bad rash all over body  . Dizziness and weakness   Immunizations Administered    Name Date Dose VIS Date Route   Moderna COVID-19 Vaccine 01/24/2020 10:08 AM 0.5 mL 09/2019 Intramuscular   Manufacturer: Levan Hurst   Lot: [id number]   [id number]      Covid-19 Vaccination Clinic  Name:  Tricia Savage    MRN: [id number] DOB: November 22, 1945  01/24/2020  [name] was observed post Covid-19 immunization for 15 minutes without incident. She was provided with Vaccine Information Sheet and instruction to access the V-Safe system.   [name] was instructed to call 911 with any severe reactions post vaccine: Marland Kitchen Difficulty breathing  . Swelling of face and throat  . A fast heartbeat  . A bad rash all over body  . Dizziness and weakness   Immunizations Administered    Name Date Dose VIS Date Route   Moderna COVID-19 Vaccine 01/24/2020 10:08 AM 0.5 mL 09/2019 Intramuscular   Manufacturer: Moderna   Lot: [id number]   [place]:[id number]

## 2020-04-07 DIAGNOSIS — E782 Mixed hyperlipidemia: Secondary | ICD-10-CM | POA: Diagnosis not present

## 2020-04-07 DIAGNOSIS — M5412 Radiculopathy, cervical region: Secondary | ICD-10-CM | POA: Diagnosis not present

## 2020-04-07 DIAGNOSIS — Z634 Disappearance and death of family member: Secondary | ICD-10-CM | POA: Diagnosis not present

## 2020-04-07 DIAGNOSIS — I10 Essential (primary) hypertension: Secondary | ICD-10-CM | POA: Diagnosis not present

## 2020-04-08 ENCOUNTER — Encounter: Payer: PPO | Admitting: *Deleted

## 2020-04-08 ENCOUNTER — Other Ambulatory Visit: Payer: Self-pay

## 2020-04-08 VITALS — BP 138/84 | HR 71 | Wt 169.4 lb

## 2020-04-08 DIAGNOSIS — Z006 Encounter for examination for normal comparison and control in clinical research program: Secondary | ICD-10-CM

## 2020-04-08 NOTE — Research (Signed)
Re-consented to [place] Version 8.1 [id number], Local Version 06Apr2021  Subject Name: Tricia Savage  Subject met inclusion and exclusion criteria.  The informed consent form, study requirements and expectations were reviewed with the subject and questions and concerns were addressed prior to the signing of the consent form.  The subject verbalized understanding of the trial requirements.  The subject agreed to participate in the CLEAR trial and signed the informed consent at [facility] on 04/08/20  The informed consent was obtained prior to performance of any protocol-specific procedures for the subject.  A copy of the signed informed consent was given to the subject and a copy was placed in the subject's medical record.   Preston Fleeting C   Patient was seen in clinic today for CLEAR visit [phone]. After re-consenting the patient, AE's/SAE's were reviewed, all medications reviewed, new IP medication dispensed. Next appointment made.

## 2020-06-25 ENCOUNTER — Telehealth: Payer: Self-pay | Admitting: *Deleted

## 2020-06-25 ENCOUNTER — Encounter: Payer: Self-pay | Admitting: *Deleted

## 2020-06-25 NOTE — Telephone Encounter (Signed)
LVM for Tricia Savage to call back whenever possible to do T17,M45 phone call for the CLEAR research study.

## 2020-06-25 NOTE — Progress Notes (Signed)
Was able to complete the T17,M45 visit for the CLEAR research study. All concomitant medications have been reviewed and updated. No new AE's/SAE's to report to sponsor. Subject is currently on study IP and next in clinic appointment is scheduled for January 4th, 2022 @ 8:00 am.

## 2020-08-11 DIAGNOSIS — L298 Other pruritus: Secondary | ICD-10-CM | POA: Diagnosis not present

## 2020-08-11 DIAGNOSIS — E782 Mixed hyperlipidemia: Secondary | ICD-10-CM | POA: Diagnosis not present

## 2020-08-11 DIAGNOSIS — Z889 Allergy status to unspecified drugs, medicaments and biological substances status: Secondary | ICD-10-CM | POA: Diagnosis not present

## 2020-08-11 DIAGNOSIS — I10 Essential (primary) hypertension: Secondary | ICD-10-CM | POA: Diagnosis not present

## 2020-08-11 DIAGNOSIS — I2510 Atherosclerotic heart disease of native coronary artery without angina pectoris: Secondary | ICD-10-CM | POA: Diagnosis not present

## 2020-10-05 NOTE — Progress Notes (Deleted)
Cardiology Office Note  Date: 10/05/2020   ID: Tricia Savage, Deasy 10-10-1945, MRN [id number]  PCP:  [name], MD  Cardiologist:  Carlyle Dolly, MD Electrophysiologist:  None   Chief Complaint: Follow-up CAD, HLD, HTN.  History of Present Illness: Tricia Savage is a 75 y.o. female with a history of CAD status post NSTEMI August 2017 with DES to ramus, echo with EF of 55 to 60%, basal inferior hypokinesis.  History of HLD, HTN.  At last encounter via telemedicine May 18, 2019 with Dr. Harl Bowie, patient denied any anginal or exertional symptoms.  Her ACE inhibitor was stopped secondary to angioedema.  She was continuing Zetia due to intolerance to statins. Her blood pressure was stable at home.  Patient states she has been doing well since last visit in August 2020.  Denies any recent acute illnesses, hospitalizations, pending surgeries, or travels.  Denies any anginal or exertional symptoms.  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  Denies any stroke or TIA-like symptoms, orthostatic symptoms, palpitations or arrhythmias, bleeding issues.  Denies any claudication-like symptoms, DVT or PE-like symptoms, or lower extremity edema.  Her last LDL was 172   Past Medical History:  Diagnosis Date  . Atrial fibrillation ([place])    a. occurred in 2000, never placed on anti-coagulation. No known recurrence.   Marland Kitchen CAD (coronary artery disease)    a. cath 05/2016: 99% stenosis RI (DES placed), mild non-obstructive disease RCA, 70% along small caliber diagonal branch  . HLD (hyperlipidemia)    a. intoelrant to statin therapy  . Hypertension   . Trigeminal neuralgia     Past Surgical History:  Procedure Laterality Date  . ABDOMINAL HYSTERECTOMY    . CARDIAC CATHETERIZATION N/A 05/14/2016   Procedure: Left Heart Cath and Coronary Angiography;  Surgeon: Burnell Blanks, MD;  Location: [facility];  Service: Cardiovascular;  Laterality: N/A;  .  CARDIAC CATHETERIZATION N/A 05/14/2016   Procedure: Coronary Stent Intervention;  Surgeon: Burnell Blanks, MD;  Location: [facility];  Service: Cardiovascular;  Laterality: N/A;  . CHOLECYSTECTOMY     AGE 7  . COLONOSCOPY N/A 12/25/2018   Procedure: COLONOSCOPY;  Surgeon: Danie Binder, MD;  [facility];  Service: Endoscopy;  Laterality: N/A;  2:15pm  . 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   Marland Kitchen POLYPECTOMY  12/25/2018   Procedure: POLYPECTOMY;  Surgeon: Danie Binder, MD;  [facility];  Service: Endoscopy;;    Current Outpatient Medications  Medication Sig Dispense Refill  . AMBULATORY NON FORMULARY MEDICATION Take 180 mg by mouth daily. Medication Name: bempedoic acid, CLEAR Research Study drug provided    . aspirin EC 81 MG EC tablet Take 1 tablet (81 mg total) by mouth daily.    . carbamazepine (CARBATROL) 200 MG 12 hr capsule Take 200 mg by mouth 2 (two) times daily as needed (FACIAL PAIN).     Marland Kitchen ezetimibe (ZETIA) 10 MG tablet Take 1 tablet (10 mg total) by mouth daily. 90 tablet 2  . hydrochlorothiazide (HYDRODIURIL) 25 MG tablet Take 12.5 mg by mouth daily.     Marland Kitchen loratadine (CLARITIN) 10 MG tablet Take 10 mg by mouth daily as needed for allergies.    . metoprolol tartrate (LOPRESSOR) 25 MG tablet Take 25 mg by mouth 2 (two) times daily. Take 1 Tablet in the AM and 1/2 in the PM    .  nitroGLYCERIN (NITROSTAT) 0.4 MG SL tablet Place 0.4 mg under the tongue every 5 (five) minutes as needed for chest pain.    Marland Kitchen omeprazole (PRILOSEC) 20 MG capsule TAKE 1 CAPSULE BY MOUTH EVERY DAY 30 MINUTES PRIOR TO BREAKFAST 90 capsule 3   No current facility-administered medications for this visit.   Allergies:  Bee venom, Lisinopril, Atorvastatin, Codeine, Erythromycin, Milk-related compounds, Morphine and related, Niacin and related, Other, and Statins   Social History: The patient  reports that she has never smoked. She has never used  smokeless tobacco. She reports that she does not drink alcohol and does not use drugs.   Family History: The patient's family history includes Diabetes in her father; Heart Problems in her mother; Heart attack in her brother, brother, and maternal grandfather; Stroke in her father.   ROS:  Please see the history of present illness. Otherwise, complete review of systems is positive for none.  All other systems are reviewed and negative.    Physical Exam: VS:  There were no vitals taken for this visit., BMI There is no height or weight on file to calculate BMI.  Wt Readings from Last 3 Encounters:  04/08/20 169 lb 6.4 oz (76.8 kg)  01/01/20 176 lb (79.8 kg)  10/09/19 178 lb 6.4 oz (80.9 kg)    General: Patient appears comfortable at rest. HEENT: Conjunctiva and lids normal, oropharynx clear with moist mucosa. Neck: Supple, no elevated JVP or carotid bruits, no thyromegaly. Lungs: Clear to auscultation, nonlabored breathing at rest. Cardiac: Regular rate and rhythm, no S3 or significant systolic murmur, no pericardial rub. Abdomen: Soft, nontender, no hepatomegaly, bowel sounds present, no guarding or rebound. Extremities: No pitting edema, distal pulses 2+. Skin: Warm and dry. Musculoskeletal: No kyphosis. Neuropsychiatric: Alert and oriented x3, affect grossly appropriate.  ECG: 01/01/2020.  EKG today shows normal sinus rhythm rate of 66 with first-degree AV block PR interval of 206 ms.  ST and T wave abnormality, consider inferior ischemia  Recent Labwork: No results found for requested labs within last 8760 hours.     Component Value Date/Time   CHOL 245 (H) 08/13/2016 0934   TRIG 167 (H) 08/13/2016 0934   HDL 40 (L) 08/13/2016 0934   CHOLHDL 6.1 (H) 08/13/2016 0934   VLDL 33 (H) 08/13/2016 0934   LDLCALC 172 (H) 08/13/2016 0934    Other Studies Reviewed Today:  05/2016 cath   Prox RCA to Mid RCA lesion, 40 %stenosed.  Ost Ramus lesion, 30 %stenosed.  A STENT PROMUS  PREM MR 2.25X16 drug eluting stent was successfully placed.  Ramus lesion, 99 %stenosed.  Post intervention, there is a 0% residual stenosis.  1st Diag lesion, 70 %stenosed.  The left ventricular systolic function is normal.  LV end diastolic pressure is normal.  The left ventricular ejection fraction is 55-65% by visual estimate.  There is no mitral valve regurgitation.  1. Severe stenosis mid Ramus intermediate branch, culprit for NSTEMI 2. Mild non-obstructive disease in the RCA.  3. Moderate stenosis small caliber Diagonal branch 4. Normal LV systolic function 5. Successful PTCA/DES x 1 mid Ramus intermediate branch  Recommendations: Continue DAPT with ASA and Brilinta for one year. She is statin intolerant. Consider PCSK9-inh after discharge. Start beta blocker.  Diagnostic Dominance: Right  Intervention     Assessment and Plan:  No diagnosis found. 1. CAD in native artery Status post NSTEMI with cardiac catheterization August 2017 proximal to mid RCA lesion 40%, ostial ramus lesion 30% ramus lesion 99% stenosed  with stent Promus Premier 2.25 x 16 DES successfully placed.  First diagonal 70%.  Patient denies any progressive anginal or exertional symptoms.  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  She is very active on a daily basis.  Continue aspirin 81 mg, metoprolol 25 mg p.o. twice daily.  Continue sublingual nitroglycerin as needed.  2. Mixed hyperlipidemia  Intolerant to statins.  Currently on Zetia 10 mg daily.  Last known LDL lab work showed an LDL of 172.  Offered patient PCSK9 inhibitor.  Patient refuses therapy stating she has multiple allergies and is afraid she may be allergic to this medication.  She states she also does not like being stuck by a needle.  She needs a refill on her Zetia.  Please refill Zetia 10 mg daily.    3. Essential hypertension Blood pressure is 138/84 today.  Continue hydrochlorothiazid 12.5 mg  daily.  Patient had to discontinue ACE inhibitor due to angioedema in the past.  States her blood pressures are usually 120s to 130s over 70s at home.  Medication Adjustments/Labs and Tests Ordered: Current medicines are reviewed at length with the patient today.  Concerns regarding medicines are outlined above.   Disposition: Follow-up with Dr. Wyline Mood or APP 6 months  Signed, Rennis Harding, NP 10/05/2020 9:26 PM    [facility] [address] [place], [place], [place] [address] Phone: [phone]; Fax: [phone]

## 2020-10-06 ENCOUNTER — Ambulatory Visit: Payer: PPO | Admitting: Family Medicine

## 2020-10-07 ENCOUNTER — Encounter: Payer: PPO | Admitting: *Deleted

## 2020-10-07 ENCOUNTER — Other Ambulatory Visit: Payer: Self-pay

## 2020-10-07 VITALS — BP 160/91 | HR 65

## 2020-10-07 DIAGNOSIS — Z006 Encounter for examination for normal comparison and control in clinical research program: Secondary | ICD-10-CM

## 2020-10-07 NOTE — Research (Signed)
Subject came into lab today for T18, M48 visit for the CLEAR research study. Subject is currently on IP and was 93% compliant since last visit. All concomitant medications have been reviewed and updated if applicable. There are no new AE's or SAE's to report to sponsor at this time. New IP was dispensed and next appointment scheduled for Wednesday, July 6th, 2022 @ 0800.

## 2020-10-12 NOTE — Progress Notes (Deleted)
Cardiology Office Note  Date: 10/12/2020   ID: Tricia, Savage 1945-12-27, MRN [id number]  PCP:  [name], MD  Cardiologist:  Carlyle Dolly, MD Electrophysiologist:  None   Chief Complaint: Follow-up CAD, HLD, HTN.  History of Present Illness: Tricia Savage is a 75 y.o. female with a history of CAD status post NSTEMI August 2017 with DES to ramus, echo with EF of 55 to 60%, basal inferior hypokinesis.  History of HLD, HTN.  At last encounter via telemedicine May 18, 2019 with Dr. Harl Bowie, patient denied any anginal or exertional symptoms.  Her ACE inhibitor was stopped secondary to angioedema.  She was continuing Zetia due to intolerance to statins. Her blood pressure was stable at home.  Patient states she has been doing well since last visit in August 2020.  Denies any recent acute illnesses, hospitalizations, pending surgeries, or travels.  Denies any anginal or exertional symptoms.  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  Denies any stroke or TIA-like symptoms, orthostatic symptoms, palpitations or arrhythmias, bleeding issues.  Denies any claudication-like symptoms, DVT or PE-like symptoms, or lower extremity edema.  Her last LDL was 172   Past Medical History:  Diagnosis Date  . Atrial fibrillation ([place])    a. occurred in 2000, never placed on anti-coagulation. No known recurrence.   Marland Kitchen CAD (coronary artery disease)    a. cath 05/2016: 99% stenosis RI (DES placed), mild non-obstructive disease RCA, 70% along small caliber diagonal branch  . HLD (hyperlipidemia)    a. intoelrant to statin therapy  . Hypertension   . Trigeminal neuralgia     Past Surgical History:  Procedure Laterality Date  . ABDOMINAL HYSTERECTOMY    . CARDIAC CATHETERIZATION N/A 05/14/2016   Procedure: Left Heart Cath and Coronary Angiography;  Surgeon: Burnell Blanks, MD;  Location: [facility];  Service: Cardiovascular;  Laterality: N/A;  .  CARDIAC CATHETERIZATION N/A 05/14/2016   Procedure: Coronary Stent Intervention;  Surgeon: Burnell Blanks, MD;  Location: [facility];  Service: Cardiovascular;  Laterality: N/A;  . CHOLECYSTECTOMY     AGE 27  . COLONOSCOPY N/A 12/25/2018   Procedure: COLONOSCOPY;  Surgeon: Danie Binder, MD;  [facility];  Service: Endoscopy;  Laterality: N/A;  2:15pm  . 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   Marland Kitchen POLYPECTOMY  12/25/2018   Procedure: POLYPECTOMY;  Surgeon: Danie Binder, MD;  [facility];  Service: Endoscopy;;    Current Outpatient Medications  Medication Sig Dispense Refill  . AMBULATORY NON FORMULARY MEDICATION Take 180 mg by mouth daily. Medication Name: bempedoic acid, CLEAR Research Study drug provided    . aspirin EC 81 MG EC tablet Take 1 tablet (81 mg total) by mouth daily.    . carbamazepine (CARBATROL) 200 MG 12 hr capsule Take 200 mg by mouth 2 (two) times daily as needed (FACIAL PAIN).     Marland Kitchen ezetimibe (ZETIA) 10 MG tablet Take 1 tablet (10 mg total) by mouth daily. 90 tablet 2  . hydrochlorothiazide (HYDRODIURIL) 25 MG tablet Take 12.5 mg by mouth daily.     Marland Kitchen loratadine (CLARITIN) 10 MG tablet Take 10 mg by mouth daily as needed for allergies.    . metoprolol tartrate (LOPRESSOR) 25 MG tablet Take 25 mg by mouth 2 (two) times daily. Take 1 Tablet in the AM and 1/2 in the PM    .  nitroGLYCERIN (NITROSTAT) 0.4 MG SL tablet Place 0.4 mg under the tongue every 5 (five) minutes as needed for chest pain.    Marland Kitchen omeprazole (PRILOSEC) 20 MG capsule TAKE 1 CAPSULE BY MOUTH EVERY DAY 30 MINUTES PRIOR TO BREAKFAST 90 capsule 3   No current facility-administered medications for this visit.   Allergies:  Bee venom, Lisinopril, Atorvastatin, Codeine, Erythromycin, Milk-related compounds, Morphine and related, Niacin and related, Other, and Statins   Social History: The patient  reports that she has never smoked. She has never used  smokeless tobacco. She reports that she does not drink alcohol and does not use drugs.   Family History: The patient's family history includes Diabetes in her father; Heart Problems in her mother; Heart attack in her brother, brother, and maternal grandfather; Stroke in her father.   ROS:  Please see the history of present illness. Otherwise, complete review of systems is positive for none.  All other systems are reviewed and negative.    Physical Exam: VS:  There were no vitals taken for this visit., BMI There is no height or weight on file to calculate BMI.  Wt Readings from Last 3 Encounters:  04/08/20 169 lb 6.4 oz (76.8 kg)  01/01/20 176 lb (79.8 kg)  10/09/19 178 lb 6.4 oz (80.9 kg)    General: Patient appears comfortable at rest. HEENT: Conjunctiva and lids normal, oropharynx clear with moist mucosa. Neck: Supple, no elevated JVP or carotid bruits, no thyromegaly. Lungs: Clear to auscultation, nonlabored breathing at rest. Cardiac: Regular rate and rhythm, no S3 or significant systolic murmur, no pericardial rub. Abdomen: Soft, nontender, no hepatomegaly, bowel sounds present, no guarding or rebound. Extremities: No pitting edema, distal pulses 2+. Skin: Warm and dry. Musculoskeletal: No kyphosis. Neuropsychiatric: Alert and oriented x3, affect grossly appropriate.  ECG: 01/01/2020.  EKG today shows normal sinus rhythm rate of 66 with first-degree AV block PR interval of 206 ms.  ST and T wave abnormality, consider inferior ischemia  Recent Labwork: No results found for requested labs within last 8760 hours.     Component Value Date/Time   CHOL 245 (H) 08/13/2016 0934   TRIG 167 (H) 08/13/2016 0934   HDL 40 (L) 08/13/2016 0934   CHOLHDL 6.1 (H) 08/13/2016 0934   VLDL 33 (H) 08/13/2016 0934   LDLCALC 172 (H) 08/13/2016 0934    Other Studies Reviewed Today:  05/2016 cath   Prox RCA to Mid RCA lesion, 40 %stenosed.  Ost Ramus lesion, 30 %stenosed.  A STENT PROMUS  PREM MR 2.25X16 drug eluting stent was successfully placed.  Ramus lesion, 99 %stenosed.  Post intervention, there is a 0% residual stenosis.  1st Diag lesion, 70 %stenosed.  The left ventricular systolic function is normal.  LV end diastolic pressure is normal.  The left ventricular ejection fraction is 55-65% by visual estimate.  There is no mitral valve regurgitation.  1. Severe stenosis mid Ramus intermediate branch, culprit for NSTEMI 2. Mild non-obstructive disease in the RCA.  3. Moderate stenosis small caliber Diagonal branch 4. Normal LV systolic function 5. Successful PTCA/DES x 1 mid Ramus intermediate branch  Recommendations: Continue DAPT with ASA and Brilinta for one year. She is statin intolerant. Consider PCSK9-inh after discharge. Start beta blocker.  Diagnostic Dominance: Right  Intervention     Assessment and Plan:   1. CAD in native artery Status post NSTEMI with cardiac catheterization August 2017 proximal to mid RCA lesion 40%, ostial ramus lesion 30% ramus lesion 99% stenosed with stent  Promus Premier 2.25 x 16 DES successfully placed.  First diagonal 70%.  Patient denies any progressive anginal or exertional symptoms.  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  She is very active on a daily basis.  Continue aspirin 81 mg, metoprolol 25 mg p.o. twice daily.  Continue sublingual nitroglycerin as needed.  2. Mixed hyperlipidemia  Intolerant to statins.  Currently on Zetia 10 mg daily.  Last known LDL lab work showed an LDL of 172.  Offered patient PCSK9 inhibitor.  Patient refuses therapy stating she has multiple allergies and is afraid she may be allergic to this medication.  She states she also does not like being stuck by a needle.  She needs a refill on her Zetia.  Please refill Zetia 10 mg daily.    3. Essential hypertension Blood pressure is 138/84 today.  Continue hydrochlorothiazid 12.5 mg daily.  Patient had to  discontinue ACE inhibitor due to angioedema in the past.  States her blood pressures are usually 120s to 130s over 70s at home.  Medication Adjustments/Labs and Tests Ordered: Current medicines are reviewed at length with the patient today.  Concerns regarding medicines are outlined above.   Disposition: Follow-up with Dr. Harl Bowie or APP 6 months  Signed, Levell July, NP 10/12/2020 7:56 PM    [facility] [place], [place], [place] [address] Phone: [phone]; Fax: [phone]

## 2020-10-13 ENCOUNTER — Ambulatory Visit: Payer: PPO | Admitting: Family Medicine

## 2020-10-13 DIAGNOSIS — E782 Mixed hyperlipidemia: Secondary | ICD-10-CM

## 2020-10-13 DIAGNOSIS — I10 Essential (primary) hypertension: Secondary | ICD-10-CM

## 2020-10-13 DIAGNOSIS — I2510 Atherosclerotic heart disease of native coronary artery without angina pectoris: Secondary | ICD-10-CM

## 2020-10-30 NOTE — Progress Notes (Unsigned)
Cardiology Office Note  Date: 10/31/2020   ID: [name], Dust Mar 02, 1946, [name]  PCP:  [name], MD  Cardiologist:  Carlyle Dolly, MD Electrophysiologist:  None   Chief Complaint: Follow-up CAD, HLD, HTN.  History of Present Illness: Tricia Savage is a 75 y.o. female with a history of CAD status post NSTEMI August 2017 with DES to ramus, echo with EF of 55 to 60%, basal inferior hypokinesis.  History of HLD, HTN.  At last encounter via telemedicine May 18, 2019 with Dr. Harl Bowie, patient denied any anginal or exertional symptoms.  Her ACE inhibitor was stopped secondary to angioedema.  She was continuing Zetia due to intolerance to statins. Her blood pressure was stable at home.  At last visit patient stated she had been doing well since prior visit in August 2020.  Denied any recent acute illnesses, hospitalizations, pending surgeries, or travels.  Denied any anginal or exertional symptoms.  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  Denied any stroke or TIA-like symptoms, orthostatic symptoms, palpitations or arrhythmias, bleeding issues.  Denied any claudication-like symptoms, DVT or PE-like symptoms, or lower extremity edema.  Her last LDL was 172  Patient is here for 48-month follow-up.  She states in the interim she recently had Covid infection on October 11, 2020.  States she was having a lot of coughing, shortness of breath, and chest discomfort along with some diarrhea associated.  She states the coughing has gotten better and chest discomfort is improving.  She states she initially was sleeping in a recliner due to shortness of breath when lying flat but this has improved.  She is back at work.  She denies any other issues in the interim.  Denies any current anginal or exertional symptoms, orthostatic symptoms, PND, orthopnea.  Denies states her blood pressure was elevated during the time she had the Covid infection.  States it is  improving now.  Past Medical History:  Diagnosis Date  . Atrial fibrillation ([place])    a. occurred in 2000, never placed on anti-coagulation. No known recurrence.   Marland Kitchen CAD (coronary artery disease)    a. cath 05/2016: 99% stenosis RI (DES placed), mild non-obstructive disease RCA, 70% along small caliber diagonal branch  . HLD (hyperlipidemia)    a. intoelrant to statin therapy  . Hypertension   . Trigeminal neuralgia     Past Surgical History:  Procedure Laterality Date  . ABDOMINAL HYSTERECTOMY    . CARDIAC CATHETERIZATION N/A 05/14/2016   Procedure: Left Heart Cath and Coronary Angiography;  Surgeon: Burnell Blanks, MD;  Location: [facility];  Service: Cardiovascular;  Laterality: N/A;  . CARDIAC CATHETERIZATION N/A 05/14/2016   Procedure: Coronary Stent Intervention;  Surgeon: Burnell Blanks, MD;  Location: [facility];  Service: Cardiovascular;  Laterality: N/A;  . CHOLECYSTECTOMY     AGE 64  . COLONOSCOPY N/A 12/25/2018   Procedure: COLONOSCOPY;  Surgeon: Danie Binder, MD;  [facility];  Service: Endoscopy;  Laterality: N/A;  2:15pm  . 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   Marland Kitchen POLYPECTOMY  12/25/2018   Procedure: POLYPECTOMY;  Surgeon: Danie Binder, MD;  [facility];  Service: Endoscopy;;    Current Outpatient Medications  Medication Sig Dispense Refill  . AMBULATORY NON FORMULARY MEDICATION Take 180 mg by mouth daily. Medication Name: bempedoic acid, CLEAR Research Study drug provided    .  aspirin EC 81 MG EC tablet Take 1 tablet (81 mg total) by mouth daily.    . carbamazepine (CARBATROL) 200 MG 12 hr capsule Take 200 mg by mouth 2 (two) times daily as needed (FACIAL PAIN).     Marland Kitchen ezetimibe (ZETIA) 10 MG tablet Take 1 tablet (10 mg total) by mouth daily. 90 tablet 2  . hydrochlorothiazide (HYDRODIURIL) 25 MG tablet Take 12.5 mg by mouth daily.     Marland Kitchen loratadine (CLARITIN) 10 MG tablet Take 10 mg  by mouth daily as needed for allergies.    . metoprolol tartrate (LOPRESSOR) 25 MG tablet Take 25 mg by mouth 2 (two) times daily. Take 1 Tablet in the AM and 1/2 in the PM    . nitroGLYCERIN (NITROSTAT) 0.4 MG SL tablet Place 0.4 mg under the tongue every 5 (five) minutes as needed for chest pain.    Marland Kitchen omeprazole (PRILOSEC) 20 MG capsule TAKE 1 CAPSULE BY MOUTH EVERY DAY 30 MINUTES PRIOR TO BREAKFAST 90 capsule 3   No current facility-administered medications for this visit.   Allergies:  Bee venom, Lisinopril, Atorvastatin, Codeine, Erythromycin, Milk-related compounds, Morphine and related, Niacin and related, Other, and Statins   Social History: The patient  reports that she has never smoked. She has never used smokeless tobacco. She reports that she does not drink alcohol and does not use drugs.   Family History: The patient's family history includes Diabetes in her father; Heart Problems in her mother; Heart attack in her brother, brother, and maternal grandfather; Stroke in her father.   ROS:  Please see the history of present illness. Otherwise, complete review of systems is positive for none.  All other systems are reviewed and negative.    Physical Exam: VS:  BP 140/84   Pulse 60   Ht 5' 8.5" (1.74 m)   Wt 172 lb 6.4 oz (78.2 kg)   SpO2 97%   BMI 25.83 kg/m , BMI Body mass index is 25.83 kg/m.  Wt Readings from Last 3 Encounters:  10/31/20 172 lb 6.4 oz (78.2 kg)  04/08/20 169 lb 6.4 oz (76.8 kg)  01/01/20 176 lb (79.8 kg)    General: Patient appears comfortable at rest. Neck: Supple, no elevated JVP or carotid bruits, no thyromegaly. Lungs: Clear to auscultation, nonlabored breathing at rest. Cardiac: Regular rate and rhythm, no S3 or significant systolic murmur, no pericardial rub. Extremities: No pitting edema, distal pulses 2+. Skin: Warm and dry. Musculoskeletal: No kyphosis. Neuropsychiatric: Alert and oriented x3, affect grossly appropriate.  ECG: 01/01/2020.   EKG today shows normal sinus rhythm rate of 66 with first-degree AV block PR interval of 206 ms.  ST and T wave abnormality, consider inferior ischemia  Recent Labwork: No results found for requested labs within last 8760 hours.     Component Value Date/Time   CHOL 245 (H) 08/13/2016 0934   TRIG 167 (H) 08/13/2016 0934   HDL 40 (L) 08/13/2016 0934   CHOLHDL 6.1 (H) 08/13/2016 0934   VLDL 33 (H) 08/13/2016 0934   LDLCALC 172 (H) 08/13/2016 0934    Other Studies Reviewed Today:  05/2016 cath   Prox RCA to Mid RCA lesion, 40 %stenosed.  Ost Ramus lesion, 30 %stenosed.  A STENT PROMUS PREM MR 2.25X16 drug eluting stent was successfully placed.  Ramus lesion, 99 %stenosed.  Post intervention, there is a 0% residual stenosis.  1st Diag lesion, 70 %stenosed.  The left ventricular systolic function is normal.  LV end diastolic pressure is  normal.  The left ventricular ejection fraction is 55-65% by visual estimate.  There is no mitral valve regurgitation.  1. Severe stenosis mid Ramus intermediate branch, culprit for NSTEMI 2. Mild non-obstructive disease in the RCA.  3. Moderate stenosis small caliber Diagonal branch 4. Normal LV systolic function 5. Successful PTCA/DES x 1 mid Ramus intermediate branch  Recommendations: Continue DAPT with ASA and Brilinta for one year. She is statin intolerant. Consider PCSK9-inh after discharge. Start beta blocker.  Diagnostic Dominance: Right  Intervention     Assessment and Plan:   1. CAD in native artery Denies any current anginal or exertional symptoms.  She is just getting over a Covid infection which started on January 8.  Continue aspirin 81 mg, metoprolol 25 mg p.o. twice daily.  Continue sublingual nitroglycerin as needed.  2. Mixed hyperlipidemia  Intolerant to statins.  Currently on Zetia 10 mg daily.  Last known LDL lab work showed an LDL of 172.  Offered patient PCSK9 inhibitor.  Patient refuses therapy stating  she has multiple allergies and is afraid she may be allergic to this medication.  She states she also does not like being stuck by a needle.  Continue Zetia 10 mg daily.    3. Essential hypertension Blood pressure is elevated today at 140/84.  Continue hydrochlorothiazide 12.5 mg daily.  Patient had to discontinue ACE inhibitor due to angioedema in the past.  States her blood pressures are usually 120s to 130s over 70s at home.  Medication Adjustments/Labs and Tests Ordered: Current medicines are reviewed at length with the patient today.  Concerns regarding medicines are outlined above.   Disposition: Follow-up with Dr. Harl Bowie or APP 6 months  Signed, Levell July, NP 10/31/2020 3:02 PM    [facility] [place], [place], [place] [address] Phone: [phone]; Fax: [phone]

## 2020-10-31 ENCOUNTER — Encounter: Payer: Self-pay | Admitting: Family Medicine

## 2020-10-31 ENCOUNTER — Ambulatory Visit (INDEPENDENT_AMBULATORY_CARE_PROVIDER_SITE_OTHER): Payer: PPO | Admitting: Family Medicine

## 2020-10-31 ENCOUNTER — Other Ambulatory Visit: Payer: Self-pay

## 2020-10-31 VITALS — BP 140/84 | HR 60 | Ht 68.5 in | Wt 172.4 lb

## 2020-10-31 DIAGNOSIS — I2510 Atherosclerotic heart disease of native coronary artery without angina pectoris: Secondary | ICD-10-CM

## 2020-10-31 DIAGNOSIS — I10 Essential (primary) hypertension: Secondary | ICD-10-CM

## 2020-10-31 DIAGNOSIS — E782 Mixed hyperlipidemia: Secondary | ICD-10-CM | POA: Diagnosis not present

## 2020-10-31 NOTE — Patient Instructions (Signed)
Medication Instructions:  Continue all current medications.   Labwork: none  Testing/Procedures: none  Follow-Up: 6 months   Any Other Special Instructions Will Be Listed Below (If Applicable).   If you need a refill on your cardiac medications before your next appointment, please call your pharmacy.  

## 2020-11-18 ENCOUNTER — Other Ambulatory Visit: Payer: Self-pay | Admitting: Family Medicine

## 2020-12-08 DIAGNOSIS — E782 Mixed hyperlipidemia: Secondary | ICD-10-CM | POA: Diagnosis not present

## 2020-12-08 DIAGNOSIS — Z8616 Personal history of COVID-19: Secondary | ICD-10-CM | POA: Diagnosis not present

## 2020-12-08 DIAGNOSIS — I10 Essential (primary) hypertension: Secondary | ICD-10-CM | POA: Diagnosis not present

## 2020-12-31 ENCOUNTER — Telehealth: Payer: Self-pay | Admitting: *Deleted

## 2021-01-05 NOTE — Telephone Encounter (Signed)
Completed phone call/chart review for visit T19,M51 for the CLEAR research study. There are no new AE's or SAE's to report to sponsor at this time. All concomitant medications have been reviewed and updated if applicable.

## 2021-01-22 ENCOUNTER — Other Ambulatory Visit: Payer: Self-pay | Admitting: Gastroenterology

## 2021-04-09 ENCOUNTER — Other Ambulatory Visit: Payer: Self-pay

## 2021-04-09 ENCOUNTER — Encounter: Payer: PPO | Admitting: *Deleted

## 2021-04-09 DIAGNOSIS — Z006 Encounter for examination for normal comparison and control in clinical research program: Secondary | ICD-10-CM

## 2021-04-09 NOTE — Research (Signed)
Subject came into the research clinic today for the End of Study Visit for the CLEAR research trail. Subject was 92% compliant with their IP. A physical exam was performed by the Sub-I of the study and an EKG. All concomitant medications have been reviewed and updated if applicable. There are no new AE's or SAE's to report to sponsor at this time. Subject was reminded that they'll receive a phone call from [place] in approximately 30 days from today for their post end of study phone call.

## 2021-04-10 DIAGNOSIS — E782 Mixed hyperlipidemia: Secondary | ICD-10-CM | POA: Diagnosis not present

## 2021-04-10 DIAGNOSIS — R079 Chest pain, unspecified: Secondary | ICD-10-CM | POA: Diagnosis not present

## 2021-04-10 DIAGNOSIS — I10 Essential (primary) hypertension: Secondary | ICD-10-CM | POA: Diagnosis not present

## 2021-04-10 DIAGNOSIS — G500 Trigeminal neuralgia: Secondary | ICD-10-CM | POA: Diagnosis not present

## 2021-04-30 ENCOUNTER — Telehealth: Payer: Self-pay | Admitting: *Deleted

## 2021-04-30 DIAGNOSIS — Z006 Encounter for examination for normal comparison and control in clinical research program: Secondary | ICD-10-CM

## 2021-04-30 NOTE — Telephone Encounter (Signed)
Patient called back for 30-day post study visit for Clear Study. Patient is doing well with no new adverse events and concomitant medications. I reminded patient about maintaining heart healthy diet and regular exercise program. I thanked patient for her participation in the Clear Study.

## 2021-04-30 NOTE — Telephone Encounter (Signed)
I called patient and left message for patient to call me back for 30-day post study for Clear Study.

## 2021-05-01 ENCOUNTER — Ambulatory Visit: Payer: PPO | Admitting: Family Medicine

## 2021-05-04 DIAGNOSIS — I10 Essential (primary) hypertension: Secondary | ICD-10-CM | POA: Diagnosis not present

## 2021-05-04 DIAGNOSIS — E782 Mixed hyperlipidemia: Secondary | ICD-10-CM | POA: Diagnosis not present

## 2021-05-04 DIAGNOSIS — I2510 Atherosclerotic heart disease of native coronary artery without angina pectoris: Secondary | ICD-10-CM | POA: Diagnosis not present

## 2021-05-04 DIAGNOSIS — L298 Other pruritus: Secondary | ICD-10-CM | POA: Diagnosis not present

## 2021-05-19 ENCOUNTER — Other Ambulatory Visit: Payer: Self-pay | Admitting: Family Medicine

## 2021-05-19 DIAGNOSIS — Z1231 Encounter for screening mammogram for malignant neoplasm of breast: Secondary | ICD-10-CM

## 2021-05-20 ENCOUNTER — Other Ambulatory Visit: Payer: Self-pay

## 2021-05-20 ENCOUNTER — Ambulatory Visit
Admission: RE | Admit: 2021-05-20 | Discharge: 2021-05-20 | Disposition: A | Discharge status: Home / Self Care | Payer: PPO | Source: Ambulatory Visit | Attending: Family Medicine | Admitting: Family Medicine

## 2021-05-20 DIAGNOSIS — Z1231 Encounter for screening mammogram for malignant neoplasm of breast: Secondary | ICD-10-CM

## 2021-05-22 ENCOUNTER — Encounter: Payer: Self-pay | Admitting: Family Medicine

## 2021-05-22 ENCOUNTER — Ambulatory Visit: Payer: PPO | Admitting: Family Medicine

## 2021-05-22 ENCOUNTER — Other Ambulatory Visit: Payer: Self-pay

## 2021-05-22 VITALS — BP 142/80 | HR 64 | Ht 68.0 in | Wt 172.8 lb

## 2021-05-22 DIAGNOSIS — I2510 Atherosclerotic heart disease of native coronary artery without angina pectoris: Secondary | ICD-10-CM

## 2021-05-22 DIAGNOSIS — E782 Mixed hyperlipidemia: Secondary | ICD-10-CM | POA: Diagnosis not present

## 2021-05-22 DIAGNOSIS — I10 Essential (primary) hypertension: Secondary | ICD-10-CM

## 2021-05-22 NOTE — Progress Notes (Signed)
Cardiology Office Note  Date: 05/22/2021   ID: Tricia Savage, DOB 02-Jun-1946, MRN [id number]  PCP:  [name], MD  Cardiologist:  Carlyle Dolly, MD Electrophysiologist:  None   Chief Complaint: 27-monthfollow-up  History of Present Illness: Tricia SANTAROSAis a 75y.o. female with a history of CAD status post NSTEMI August 2017 with DES to ramus, echo with EF of 55 to 60%, basal inferior hypokinesis.  History of HLD, HTN.  At last encounter via telemedicine May 18, 2019 with Dr. BHarl Bowie patient denied any anginal or exertional symptoms.  Her ACE inhibitor was stopped secondary to angioedema.  She was continuing Zetia due to intolerance to statins. Her blood pressure was stable at home.  She is here for 627-monthollow-up today.  She denies any acute illnesses or hospitalizations in the interim since last visit.  She states her daughter has been sick with 2 surgeries in the interim.  Patient states she had some swelling in her neck on the right side while her daughter was sick and has some numbness in her right hand at times.  Otherwise she denies denies any issues.  Blood pressure is elevated today at 142/80 however patient states her blood pressure is usually run in the 120s over 70s at home.  EKG today shows sinus bradycardia with a first-degree AV block rate of 54.  She states she was taking a research medication for hyperlipidemia but is not currently taking the medication.  She denies any anginal or exertional symptoms, orthostatic symptoms, CVA or TIA-like symptoms, palpitations or arrhythmias, PND, orthopnea, bleeding.  Denies any claudication-like symptoms, DVT or PE-like symptoms, or lower extremity edema.  Current cardiac regimen includes aspirin 81 mg daily, Zetia 10 mg daily, hydrochlorothiazide 12.5 mg daily, metoprolol 25 mg in a.m. and 12.5 mg in p.m.  Sublingual nitroglycerin as needed.  She states she recently had some lab work associated with her lipids 2 days ago but I  do not see results in epic.  Past Medical History:  Diagnosis Date   [place]   a. occurred in [place], never placed on anti-coagulation. No known recurrence.    CAD (coronary artery disease)    a. cath 05/2016: 99% stenosis RI (DES placed), mild non-obstructive disease RCA, 70% along small caliber diagonal branch   HLD (hyperlipidemia)    a. intoelrant to statin therapy   Hypertension    Trigeminal neuralgia     Past Surgical History:  Procedure Laterality Date   ABDOMINAL HYSTERECTOMY     CARDIAC CATHETERIZATION N/A 05/14/2016   Procedure: Left Heart Cath and Coronary Angiography;  Surgeon: ChBurnell BlanksMD;  [facility];  Service: Cardiovascular;  Laterality: N/A;   CARDIAC CATHETERIZATION N/A 05/14/2016   Procedure: Coronary Stent Intervention;  Surgeon: ChBurnell BlanksMD;  [facility];  Service: Cardiovascular;  Laterality: N/A;   CHOLECYSTECTOMY     AGE 53   COLONOSCOPY N/A 12/25/2018   Procedure: COLONOSCOPY;  Surgeon: FiDanie BinderMD;  [facility];  Service: Endoscopy;  Laterality: N/A;  2:15pm   left index finger amputation  1978   cut by bandsaw at work.    POLYPECTOMY  12/25/2018   Procedure: POLYPECTOMY;  Surgeon: FiDanie BinderMD;  [facility];  Service: Endoscopy;;    Current Outpatient Medications  Medication Sig Dispense Refill   AMBULATORY NON FORMULARY MEDICATION Take 180 mg by mouth daily. Medication Name: bempedoic  acid, CLEAR Research Study drug provided     aspirin EC 81 MG EC tablet Take 1 tablet (81 mg total) by mouth daily.     carbamazepine (CARBATROL) 200 MG 12 hr capsule Take 200 mg by mouth 2 (two) times daily as needed (FACIAL PAIN).      ezetimibe (ZETIA) 10 MG tablet TAKE 1 TABLET(10 MG) BY MOUTH DAILY 90 tablet 2   hydrochlorothiazide (HYDRODIURIL) 25 MG tablet Take 12.5 mg by mouth daily.      loratadine (CLARITIN) 10 MG tablet Take 10 mg by mouth  daily as needed for allergies.     metoprolol tartrate (LOPRESSOR) 25 MG tablet Take 25 mg by mouth 2 (two) times daily. Take 1 Tablet in the AM and 1/2 in the PM     nitroGLYCERIN (NITROSTAT) 0.4 MG SL tablet Place 0.4 mg under the tongue every 5 (five) minutes as needed for chest pain.     omeprazole (PRILOSEC) 20 MG capsule TAKE 1 CAPSULE BY MOUTH EVERY DAY 30 MINUTES BEFORE BREAKFAST 90 capsule 3   No current facility-administered medications for this visit.   Allergies:  Bee venom, Halls cough drops [menthol], Lisinopril, Atorvastatin, Codeine, Erythromycin, Milk-related compounds, Morphine and related, Niacin and related, Other, and Statins   Social History: The patient  reports that she has never smoked. She has never used smokeless tobacco. She reports that she does not drink alcohol and does not use drugs.   Family History: The patient's family history includes Diabetes in her father; Heart Problems in her mother; Heart attack in her brother, brother, and maternal grandfather; Stroke in her father.   ROS:  Please see the history of present illness. Otherwise, complete review of systems is positive for none.  All other systems are reviewed and negative.    Physical Exam: VS:  BP (!) 142/80   Pulse 64   Ht '5\' 8"'$  (1.727 m)   Wt 172 lb 12.8 oz (78.4 kg)   SpO2 96%   BMI 26.27 kg/m , BMI Body mass index is 26.27 kg/m.  Wt Readings from Last 3 Encounters:  05/22/21 172 lb 12.8 oz (78.4 kg)  10/31/20 172 lb 6.4 oz (78.2 kg)  04/08/20 169 lb 6.4 oz (76.8 kg)    General: Patient appears comfortable at rest. Neck: Supple, no elevated JVP or carotid bruits, no thyromegaly. Lungs: Clear to auscultation, nonlabored breathing at rest. Cardiac: Regular rate and rhythm, no S3 or significant systolic murmur, no pericardial rub. Extremities: No pitting edema, distal pulses 2+. Skin: Warm and dry. Musculoskeletal: No kyphosis. Neuropsychiatric: Alert and oriented x3, affect grossly  appropriate.  ECG: 05/22/2021 EKG sinus bradycardia with first-degree AV block rate of 54.  Recent Labwork: No results found for requested labs within last 8760 hours.     Component Value Date/Time   CHOL 245 (H) 08/13/2016 0934   TRIG 167 (H) 08/13/2016 0934   HDL 40 (L) 08/13/2016 0934   CHOLHDL 6.1 (H) 08/13/2016 0934   VLDL 33 (H) 08/13/2016 0934   LDLCALC 172 (H) 08/13/2016 0934    Other Studies Reviewed Today:   05/2016 cath   Prox RCA to Mid RCA lesion, 40 %stenosed. Ost Ramus lesion, 30 %stenosed. A STENT PROMUS PREM MR 2.25X16 drug eluting stent was successfully placed. Ramus lesion, 99 %stenosed. Post intervention, there is a 0% residual stenosis. 1st Diag lesion, 70 %stenosed. The left ventricular systolic function is normal. LV end diastolic pressure is normal. The left ventricular ejection fraction is 55-65% by visual  estimate. There is no mitral valve regurgitation.   1. Severe stenosis mid Ramus intermediate branch, culprit for NSTEMI 2. Mild non-obstructive disease in the RCA.  3. Moderate stenosis small caliber Diagonal branch 4. Normal LV systolic function 5. Successful PTCA/DES x 1 mid Ramus intermediate branch   Recommendations: Continue DAPT with ASA and Brilinta for one year. She is statin intolerant. Consider PCSK9-inh after discharge. Start beta blocker.  Diagnostic Dominance: Right  Intervention     Assessment and Plan:   1. CAD in native artery Denies any current anginal or exertional symptoms.    Continue aspirin 81 mg, metoprolol 25 mg a.m. and 12.5 mg p.m.  Continue sublingual nitroglycerin as needed.  2. Mixed hyperlipidemia  Intolerant to statins.  Currently on Zetia 10 mg daily.  Last known LDL lab work showed an LDL of 172.  Patient was averse to PCSK9 inhibitor.  She states she was afraid of needles.  Continue Zetia 10 mg daily.    3. Essential hypertension Blood pressure is elevated today at 142/80.  Continue hydrochlorothiazide  12.5 mg daily.  Patient had to discontinue ACE inhibitor due to angioedema in the past.  States her blood pressures are usually 120s to 130s over 70s at home.  Medication Adjustments/Labs and Tests Ordered: Current medicines are reviewed at length with the patient today.  Concerns regarding medicines are outlined above.   Disposition: Follow-up with Dr. Harl Bowie or APP 6 months  Signed, Tricia July, NP 05/22/2021 2:50 PM    [facility], [place], [place] [address] Phone: [phone]; Fax: [phone]

## 2021-05-22 NOTE — Patient Instructions (Addendum)
Medication Instructions:  Continue all current medications.   Labwork: none  Testing/Procedures: none  Follow-Up: 6 months   Any Other Special Instructions Will Be Listed Below (If Applicable).   If you need a refill on your cardiac medications before your next appointment, please call your pharmacy.  

## 2021-05-28 NOTE — Addendum Note (Signed)
Addended by: Sandie Ano D on: 05/28/2021 09:02 AM   Modules accepted: Orders

## 2021-07-21 ENCOUNTER — Telehealth: Payer: Self-pay | Admitting: Cardiology

## 2021-07-21 NOTE — Telephone Encounter (Signed)
    1. Which medications need to be refilled? (please list name of each medication and dose if known) metoprolol tartrate (LOPRESSOR) 25 MG tablet   2. Which pharmacy/location (including street and city if local pharmacy) is medication to be sent to? [facility], [address]   3. Do they need a 30 day or 90 day supply? 90  [phone] (PATIENT #)   Patient is out medication since Friday 07/17/2021

## 2021-07-22 MED ORDER — METOPROLOL TARTRATE 25 MG PO TABS: 25.0000 mg | ORAL_TABLET | Freq: Every morning | ORAL | 6 refills | Status: DC

## 2021-08-17 DIAGNOSIS — R252 Cramp and spasm: Secondary | ICD-10-CM | POA: Diagnosis not present

## 2021-08-17 DIAGNOSIS — I2510 Atherosclerotic heart disease of native coronary artery without angina pectoris: Secondary | ICD-10-CM | POA: Diagnosis not present

## 2021-08-17 DIAGNOSIS — G5601 Carpal tunnel syndrome, right upper limb: Secondary | ICD-10-CM | POA: Diagnosis not present

## 2021-08-17 DIAGNOSIS — I10 Essential (primary) hypertension: Secondary | ICD-10-CM | POA: Diagnosis not present

## 2021-08-26 DIAGNOSIS — G5601 Carpal tunnel syndrome, right upper limb: Secondary | ICD-10-CM | POA: Diagnosis not present

## 2021-08-26 DIAGNOSIS — I10 Essential (primary) hypertension: Secondary | ICD-10-CM | POA: Diagnosis not present

## 2021-08-26 DIAGNOSIS — R252 Cramp and spasm: Secondary | ICD-10-CM | POA: Diagnosis not present

## 2021-08-26 DIAGNOSIS — E782 Mixed hyperlipidemia: Secondary | ICD-10-CM | POA: Diagnosis not present

## 2021-08-26 DIAGNOSIS — Z79899 Other long term (current) drug therapy: Secondary | ICD-10-CM | POA: Diagnosis not present

## 2021-08-26 DIAGNOSIS — I2510 Atherosclerotic heart disease of native coronary artery without angina pectoris: Secondary | ICD-10-CM | POA: Diagnosis not present

## 2021-10-05 ENCOUNTER — Other Ambulatory Visit: Payer: Self-pay | Admitting: Cardiology

## 2021-11-27 ENCOUNTER — Encounter: Payer: Self-pay | Admitting: *Deleted

## 2021-11-27 ENCOUNTER — Ambulatory Visit (INDEPENDENT_AMBULATORY_CARE_PROVIDER_SITE_OTHER): Payer: PPO | Admitting: Cardiology

## 2021-11-27 ENCOUNTER — Other Ambulatory Visit: Payer: Self-pay

## 2021-11-27 ENCOUNTER — Encounter: Payer: Self-pay | Admitting: Cardiology

## 2021-11-27 VITALS — BP 180/90 | HR 60 | Ht 68.0 in | Wt 168.4 lb

## 2021-11-27 DIAGNOSIS — E782 Mixed hyperlipidemia: Secondary | ICD-10-CM

## 2021-11-27 DIAGNOSIS — I2510 Atherosclerotic heart disease of native coronary artery without angina pectoris: Secondary | ICD-10-CM | POA: Diagnosis not present

## 2021-11-27 DIAGNOSIS — I10 Essential (primary) hypertension: Secondary | ICD-10-CM | POA: Diagnosis not present

## 2021-11-27 NOTE — Patient Instructions (Signed)
Medication Instructions:  Continue all current medications.  Labwork: none  Testing/Procedures: none  Follow-Up: 6 months   Any Other Special Instructions Will Be Listed Below (If Applicable). Please call the office in 1 week with update on your blood pressure readings.   If you need a refill on your cardiac medications before your next appointment, please call your pharmacy.

## 2021-11-27 NOTE — Progress Notes (Signed)
Clinical Summary Tricia Savage is a 76 y.o.female seen today for follow up of the following medical problems.    1. CAD - admit NSTEMI 05/2016, received DES to ramus. Echo at that time LVEF 55-60%, basalinferior hypokinesis.  - beta blocker limited due to bradycardia - lisinopirl stopped due to tongue swelling during ER visit 11/2017.      - occasional chest pain. Dull pain left chest, lasts just a few seconds. No other associated symptoms. - compliant with meds   2. Hyperlipidemia - statin intolerant. Started on zetia during 05/2016 admission.  - part of CLEAR research study - reluctant for pcski9i, but more open to it now.    - labs followed by pcp - compliant with meds   3. HTN - home bp's usually around 120s/70s - she reports pcp lowered meds prevoiusly due to low bp's.   - occasional leg cramps, better on KCl - she is compliant with meds        Past Medical History:  Diagnosis Date   Atrial fibrillation ([place])    a. occurred in 2000, never placed on anti-coagulation. No known recurrence.    CAD (coronary artery disease)    a. cath 05/2016: 99% stenosis RI (DES placed), mild non-obstructive disease RCA, 70% along small caliber diagonal [name]   HLD (hyperlipidemia)    a. intoelrant to statin therapy   Hypertension    Trigeminal neuralgia      Allergies  Allergen Reactions   Bee Venom Anaphylaxis and Swelling   Halls Cough Drops [Menthol] Shortness Of Breath   Lisinopril Swelling   Atorvastatin    Codeine Nausea And Vomiting   Erythromycin    Milk-Related Compounds Other (See Comments)    Rash in inside of mouth , digestive issues   Morphine And Related Nausea And Vomiting   Niacin And Related    Other     PT CAN NOT BE AROUND HALLS COUGH DROPS -  IT TAKES HER BREATH AWAY    Statins      Current Outpatient Medications  Medication Sig Dispense Refill   aspirin EC 81 MG EC tablet Take 1 tablet (81 mg total) by mouth daily.     carbamazepine (CARBATROL)  200 MG 12 hr capsule Take 200 mg by mouth 2 (two) times daily as needed (FACIAL PAIN).      ezetimibe (ZETIA) 10 MG tablet TAKE 1 TABLET(10 MG) BY MOUTH DAILY 90 tablet 2   hydrochlorothiazide (HYDRODIURIL) 25 MG tablet Take 12.5 mg by mouth daily.      loratadine (CLARITIN) 10 MG tablet Take 10 mg by mouth daily as needed for allergies.     metoprolol tartrate (LOPRESSOR) 25 MG tablet Take 1 tablet (25 mg total) by mouth in the morning. and 1/2 in the evening 45 tablet 6   nitroGLYCERIN (NITROSTAT) 0.4 MG SL tablet Place 0.4 mg under the tongue every 5 (five) minutes as needed for chest pain.     omeprazole (PRILOSEC) 20 MG capsule TAKE 1 CAPSULE BY MOUTH EVERY DAY 30 MINUTES BEFORE BREAKFAST 90 capsule 3   No current facility-administered medications for this visit.     Past Surgical History:  Procedure Laterality Date   ABDOMINAL HYSTERECTOMY     CARDIAC CATHETERIZATION N/A 05/14/2016   Procedure: Left Heart Cath and Coronary Angiography;  Surgeon: Burnell Blanks, MD;  Location: [place] [facility];  Service: Cardiovascular;  Laterality: N/A;   CARDIAC CATHETERIZATION N/A 05/14/2016   Procedure: Coronary Stent Intervention;  Surgeon: Burnell Blanks, MD;  Location: [place] [facility];  Service: Cardiovascular;  Laterality: N/A;   CHOLECYSTECTOMY     AGE 37   COLONOSCOPY N/A 12/25/2018   Procedure: COLONOSCOPY;  Surgeon: Danie Binder, MD;  [facility];  Service: Endoscopy;  Laterality: N/A;  2:15pm   left index finger amputation  1978   cut by bandsaw at work.    POLYPECTOMY  12/25/2018   Procedure: POLYPECTOMY;  Surgeon: Danie Binder, MD;  [facility];  Service: Endoscopy;;     Allergies  Allergen Reactions   Bee Venom Anaphylaxis and Swelling   Halls Cough Drops [Menthol] Shortness Of Breath   Lisinopril Swelling   Atorvastatin    Codeine Nausea And Vomiting   Erythromycin    Milk-Related Compounds Other (See Comments)    Rash in  inside of mouth , digestive issues   Morphine And Related Nausea And Vomiting   Niacin And Related    Other     PT CAN NOT BE AROUND HALLS COUGH DROPS -  IT TAKES HER BREATH AWAY    Statins       Family History  Problem Relation Age of Onset   Heart Problems Mother    Stroke Father    Diabetes Father    Heart attack Maternal Grandfather    Heart attack Brother    Heart attack Brother    Colon cancer Neg Hx    Colon polyps Neg Hx    Breast cancer Neg Hx      Social History Tricia Savage reports that she has never smoked. She has never used smokeless tobacco. Tricia Savage reports no history of alcohol use.   Review of Systems CONSTITUTIONAL: No weight loss, fever, chills, weakness or fatigue.  HEENT: Eyes: No visual loss, blurred vision, double vision or yellow sclerae.No hearing loss, sneezing, congestion, runny nose or sore throat.  SKIN: No rash or itching.  CARDIOVASCULAR: per hpi RESPIRATORY: per hpi GASTROINTESTINAL: No anorexia, nausea, vomiting or diarrhea. No abdominal pain or blood.  GENITOURINARY: No burning on urination, no polyuria NEUROLOGICAL: No headache, dizziness, syncope, paralysis, ataxia, numbness or tingling in the extremities. No change in bowel or bladder control.  MUSCULOSKELETAL: No muscle, back pain, joint pain or stiffness.  LYMPHATICS: No enlarged nodes. No history of splenectomy.  PSYCHIATRIC: No history of depression or anxiety.  ENDOCRINOLOGIC: No reports of sweating, cold or heat intolerance. No polyuria or polydipsia.  Marland Kitchen   Physical Examination Today's Vitals   11/27/21 1526 11/27/21 1536  BP: (!) 200/108 (!) 180/90  Pulse: 60   SpO2: 98%   Weight: 168 lb 6.4 oz (76.4 kg)   Height: 5\' 8"  (1.727 m)    Body mass index is 25.61 kg/m.  Gen: resting comfortably, no acute distress HEENT: no scleral icterus, pupils equal round and reactive, no palptable cervical adenopathy,  CV: RRR, no m/r/g no jvd Resp: Clear to auscultation bilaterally GI:  abdomen is soft, non-tender, non-distended, normal bowel sounds, no hepatosplenomegaly MSK: extremities are warm, no edema.  Skin: warm, no rash Neuro:  no focal deficits Psych: appropriate affect   Diagnostic Studies  05/2016 cath   Prox RCA to Mid RCA lesion, 40 %stenosed. Ost Ramus lesion, 30 %stenosed. A STENT PROMUS PREM MR 2.25X16 drug eluting stent was successfully placed. Ramus lesion, 99 %stenosed. Post intervention, there is a 0% residual stenosis. 1st Diag lesion, 70 %stenosed. The left ventricular systolic function is normal. LV end diastolic pressure is  normal. The left ventricular ejection fraction is 55-65% by visual estimate. There is no mitral valve regurgitation.   1. Severe stenosis mid Ramus intermediate Reiner Loewen, culprit for NSTEMI 2. Mild non-obstructive disease in the RCA.  3. Moderate stenosis small caliber Diagonal Tyreonna Czaplicki 4. Normal LV systolic function 5. Successful PTCA/DES x 1 mid Ramus intermediate Sherril Heyward   Recommendations: Continue DAPT with ASA and Brilinta for one year. She is statin intolerant. Consider PCSK9-inh after discharge. Start beta blocker. Follow up in [facility] office.    Assessment and Plan  1. CAD - no symptoms, continue current meds       2. Hyperlipidemia - intolerant to statins, continue zetia    3. HTN - elevated in clinic, she will call with home bp's in 1 week      Arnoldo Lenis, M.D.

## 2021-12-04 ENCOUNTER — Encounter: Payer: Self-pay | Admitting: Cardiology

## 2021-12-23 NOTE — Telephone Encounter (Signed)
Bp too high, can she start norvasc '5mg'$  daily ? ?Zandra Abts MD ?

## 2022-01-25 ENCOUNTER — Telehealth: Payer: Self-pay

## 2022-01-25 ENCOUNTER — Encounter: Payer: Self-pay | Admitting: Cardiology

## 2022-01-25 MED ORDER — AMLODIPINE BESYLATE 5 MG PO TABS: 5.0000 mg | ORAL_TABLET | Freq: Every day | ORAL | 3 refills | Status: DC

## 2022-01-25 NOTE — Telephone Encounter (Signed)
Rx amlodipine 5 mg po qd #90 with RF:3 sent to [facility] ?

## 2022-01-25 NOTE — Telephone Encounter (Signed)
Merlene Laughter, RN ?to Betsey Holiday   ?   2:02 PM ?Per Dr. Harl Bowie- Your blood pressures are too high. He wants you to start amlodipine (norvasc) '5mg'$  daily. I can send this to your pharmacy [facility]). Please let me know if you agree. ?  ?Zandra Abts MD ?

## 2022-03-25 ENCOUNTER — Other Ambulatory Visit: Payer: Self-pay | Admitting: Nurse Practitioner

## 2022-03-25 ENCOUNTER — Encounter: Payer: Self-pay | Admitting: Gastroenterology

## 2022-03-25 NOTE — Telephone Encounter (Signed)
I have refilled once but needs office visit. Can be virtual

## 2022-04-01 NOTE — Progress Notes (Signed)
Primary Care Physician:  [name], MD  Primary GI: Dr. Abbey Chatters  Patient Location: Home   Provider Location: [facility] office   Reason for Visit: Follow-up   Persons present on the virtual encounter, with roles: Aliene Altes, PA-C (Provider), Tricia Savage (patient)   Total time (minutes) spent on medical discussion: 5 minutes  Virtual Visit via telephone note Due to COVID-19, visit is conducted virtually and was requested by patient.   I connected with on 04/02/22 at  8:00 AM EDT by telephone and verified that I am speaking with the correct person using two identifiers.   I discussed the limitations, risks, security and privacy concerns of performing an evaluation and management service by telephone and the availability of in person appointments. I also discussed with the patient that there may be a patient responsible charge related to this service. The patient expressed understanding and agreed to proceed.  Chief Complaint  Patient presents with   Follow-up    Pt states she doesn't have any issues at this time     History of Present Illness: 76 year old female with history of CAD s/p PCI, HTN, HLD, GERD, colon polyp in 2020 (not retrieved) with no recommendations for repeat due to age, presenting today for medication refills for GERD.  We have not seen patient since 2020.  Today:  Doing well overall. No concerns for me today. GERD symptoms are well controlled on omeprazole daily. Denies abdominal pain, nausea, vomiting, dysphagia. Also denies brbpr, melena, change in bowel habits, or unintentional weight loss.   Trying to lose weight. Working on her diet.   Visit today was conducted via telephone as patient was unable to connect to video.  Past Medical History:  Diagnosis Date   Atrial fibrillation ([place])    a. occurred in 2000, never placed on anti-coagulation. No known recurrence.    CAD (coronary artery disease)    a. cath 05/2016: 99% stenosis RI (DES placed),  mild non-obstructive disease RCA, 70% along small caliber diagonal branch   HLD (hyperlipidemia)    a. intoelrant to statin therapy   Hypertension    Trigeminal neuralgia      Past Surgical History:  Procedure Laterality Date   ABDOMINAL HYSTERECTOMY     CARDIAC CATHETERIZATION N/A 05/14/2016   Procedure: Left Heart Cath and Coronary Angiography;  Surgeon: Burnell Blanks, MD;  Location: [place] [facility];  Service: Cardiovascular;  Laterality: N/A;   CARDIAC CATHETERIZATION N/A 05/14/2016   Procedure: Coronary Stent Intervention;  Surgeon: Burnell Blanks, MD;  Location: [place] [facility];  Service: Cardiovascular;  Laterality: N/A;   CHOLECYSTECTOMY     AGE 33   COLONOSCOPY N/A 12/25/2018   Procedure: COLONOSCOPY;  Surgeon: Danie Binder, MD;  [facility];  Service: Endoscopy;  Laterality: N/A;  2:15pm   left index finger amputation  1978   cut by bandsaw at work.    POLYPECTOMY  12/25/2018   Procedure: POLYPECTOMY;  Surgeon: Danie Binder, MD;  [facility];  Service: Endoscopy;;     Current Meds  Medication Sig   amLODipine (NORVASC) 5 MG tablet Take 1 tablet (5 mg total) by mouth daily.   aspirin EC 81 MG EC tablet Take 1 tablet (81 mg total) by mouth daily.   carbamazepine (CARBATROL) 200 MG 12 hr capsule Take 200 mg by mouth 2 (two) times daily as needed (FACIAL PAIN).    ezetimibe (ZETIA) 10 MG tablet TAKE 1 TABLET(10 MG) BY  MOUTH DAILY   hydrochlorothiazide (HYDRODIURIL) 25 MG tablet Take 12.5 mg by mouth daily.    loratadine (CLARITIN) 10 MG tablet Take 10 mg by mouth daily as needed for allergies.   metoprolol tartrate (LOPRESSOR) 25 MG tablet Take 1 tablet (25 mg total) by mouth in the morning. and 1/2 in the evening   nitroGLYCERIN (NITROSTAT) 0.4 MG SL tablet Place 0.4 mg under the tongue every 5 (five) minutes as needed for chest pain.   potassium chloride SA (KLOR-CON M) 20 MEQ tablet Take 20 mEq by mouth daily.    [DISCONTINUED] omeprazole (PRILOSEC) 20 MG capsule TAKE 1 CAPSULE BY MOUTH EVERY DAY 43 MINUTES BEFORE BREAKFAST     Family History  Problem Relation Age of Onset   Heart Problems Mother    Stroke Father    Diabetes Father    Heart attack Maternal Grandfather    Heart attack Brother    Heart attack Brother    Colon cancer Neg Hx    Colon polyps Neg Hx    Breast cancer Neg Hx     Social History   Socioeconomic History   Marital status: Divorced    Spouse name: Not on file   Number of children: Not on file   Years of education: Not on file   Highest education level: Not on file  Occupational History   Not on file  Tobacco Use   Smoking status: Never   Smokeless tobacco: Never  Vaping Use   Vaping Use: Never used  Substance and Sexual Activity   Alcohol use: No   Drug use: No   Sexual activity: Not Currently  Other Topics Concern   Not on file  Social History Narrative   Not on file   Social Determinants of Health   Financial Resource Strain: Not on file  Food Insecurity: Not on file  Transportation Needs: Not on file  Physical Activity: Not on file  Stress: Not on file  Social Connections: Not on file       Review of Systems: Gen: Denies fever, chills, cold or flu like symptoms, pre-syncope, or syncope.  CV: Denies chest pain, palpitations. Resp: Denies dyspnea, cough.  GI: see HPI Derm: Denies rash. Psych: Denies depression, anxiety.  Heme: See HPI   Observations/Objective: No distress. Alert and oriented. Pleasant. Unable to perform complete physical exam due to telephone encounter.    Assessment:  76 year old female with history of CAD s/p PCI, HTN, HLD, GERD, colon polyp in 2020 with no recommendations for repeat due to age, presenting today for medication refills for GERD.  She is doing very well at this time on omeprazole 20 mg daily.  No breakthrough symptoms.  No alarm symptoms.  No other significant GI symptoms.   Plan: Continue omeprazole  20 mg daily 30 minutes before breakfast. Follow-up in 1 year or sooner if needed.     I discussed the assessment and treatment plan with the patient. The patient was provided an opportunity to ask questions and all were answered. The patient agreed with the plan and demonstrated an understanding of the instructions.   The patient was advised to call back or seek an in-person evaluation if the symptoms worsen or if the condition fails to improve as anticipated.  I provided 5 minutes of non-face-to-face time during this encounter.  Aliene Altes, PA-C [facility]  04/02/2022

## 2022-04-02 ENCOUNTER — Telehealth (INDEPENDENT_AMBULATORY_CARE_PROVIDER_SITE_OTHER): Payer: PPO | Admitting: Gastroenterology

## 2022-04-02 ENCOUNTER — Telehealth: Payer: Self-pay | Admitting: Gastroenterology

## 2022-04-02 ENCOUNTER — Encounter: Payer: Self-pay | Admitting: Gastroenterology

## 2022-04-02 ENCOUNTER — Telehealth: Payer: Self-pay

## 2022-04-02 VITALS — Ht 68.0 in | Wt 168.0 lb

## 2022-04-02 DIAGNOSIS — K219 Gastro-esophageal reflux disease without esophagitis: Secondary | ICD-10-CM | POA: Diagnosis not present

## 2022-04-02 MED ORDER — OMEPRAZOLE 20 MG PO CPDR: DELAYED_RELEASE_CAPSULE | ORAL | 3 refills | Status: DC

## 2022-04-02 NOTE — Telephone Encounter (Signed)
Patient needs follow-up in 1 year. Will need to be in person as she was unable to connect to video on today's visit.

## 2022-04-02 NOTE — Telephone Encounter (Signed)
.  RGA

## 2022-04-02 NOTE — Patient Instructions (Signed)
Continue omeprazole 20 mg daily 30 minutes before breakfast.  We will plan to follow-up with you in 1 year.  Do not hesitate to call sooner if you have any questions or concerns.  Aliene Altes, PA-C [facility]

## 2022-06-01 ENCOUNTER — Ambulatory Visit: Payer: PPO | Attending: Cardiology | Admitting: Cardiology

## 2022-06-01 ENCOUNTER — Encounter: Payer: Self-pay | Admitting: Cardiology

## 2022-06-01 VITALS — BP 144/85 | HR 64 | Ht 68.0 in | Wt 172.8 lb

## 2022-06-01 DIAGNOSIS — I10 Essential (primary) hypertension: Secondary | ICD-10-CM

## 2022-06-01 DIAGNOSIS — I2510 Atherosclerotic heart disease of native coronary artery without angina pectoris: Secondary | ICD-10-CM

## 2022-06-01 DIAGNOSIS — E782 Mixed hyperlipidemia: Secondary | ICD-10-CM | POA: Diagnosis not present

## 2022-06-01 MED ORDER — AMLODIPINE BESYLATE 5 MG PO TABS: 7.5000 mg | ORAL_TABLET | Freq: Every day | ORAL | 1 refills | Status: DC

## 2022-06-01 NOTE — Patient Instructions (Signed)
Medication Instructions:  Your physician has recommended you make the following change in your medication:  Increase amlodipine to 7.5 mg once a day Continue all other medications as directed  Labwork: none  Testing/Procedures: none  Follow-Up:  Your physician recommends that you schedule a follow-up appointment in: 6 months  Any Other Special Instructions Will Be Listed Below (If Applicable).  You have been referred to the lipid clinic  If you need a refill on your cardiac medications before your next appointment, please call your pharmacy.

## 2022-06-01 NOTE — Progress Notes (Signed)
Clinical Summary Tricia Savage is a 76 y.o.female seen today for follow up of the following medical problems.    1. CAD - admit NSTEMI 05/2016, received DES to ramus. Echo at that time LVEF 55-60%, basalinferior hypokinesis.  - beta blocker limited due to bradycardia - lisinopirl stopped due to tongue swelling during ER visit 11/2017.        Rare infrequent stinging chest pain, lasts just a few seconds. No exertional symptoms - compliant with meds     2. Hyperlipidemia - statin intolerant. Started on zetia during 05/2016 admission.  - part of CLEAR research study - reluctant for pcski9i, but more open to it now.     - recent labs with pcp - compliant with zetia   3. HTN - home bp's usually around 120s-140s/70-80s - she reports pcp lowered meds prevoiusly due to low bp's.      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    Past Medical History:  Diagnosis Date   Atrial fibrillation ([place])    a. occurred in 2000, never placed on anti-coagulation. No known recurrence.    CAD (coronary artery disease)    a. cath 05/2016: 99% stenosis RI (DES placed), mild non-obstructive disease RCA, 70% along small caliber diagonal Tricia Savage   HLD (hyperlipidemia)    a. intoelrant to statin therapy   Hypertension    Trigeminal neuralgia      Allergies  Allergen Reactions   Bee Venom Anaphylaxis and Swelling   Halls Cough Drops [Menthol] Shortness Of Breath   Lisinopril Swelling   Atorvastatin    Codeine Nausea And Vomiting   Erythromycin    Milk-Related Compounds Other (See Comments)    Rash in inside of mouth , digestive issues   Morphine And Related Nausea And Vomiting   Niacin And Related    Other     PT CAN NOT BE AROUND HALLS COUGH DROPS -  IT TAKES HER BREATH AWAY    Statins      Current Outpatient Medications  Medication Sig Dispense Refill   amLODipine (NORVASC) 5 MG tablet Take 1 tablet (5 mg total) by mouth daily. 90 tablet 3   aspirin EC 81 MG EC tablet Take 1  tablet (81 mg total) by mouth daily.     carbamazepine (CARBATROL) 200 MG 12 hr capsule Take 200 mg by mouth 2 (two) times daily as needed (FACIAL PAIN).      ezetimibe (ZETIA) 10 MG tablet TAKE 1 TABLET(10 MG) BY MOUTH DAILY 90 tablet 2   hydrochlorothiazide (HYDRODIURIL) 25 MG tablet Take 12.5 mg by mouth daily.      loratadine (CLARITIN) 10 MG tablet Take 10 mg by mouth daily as needed for allergies.     metoprolol tartrate (LOPRESSOR) 25 MG tablet Take 1 tablet (25 mg total) by mouth in the morning. and 1/2 in the evening 45 tablet 6   nitroGLYCERIN (NITROSTAT) 0.4 MG SL tablet Place 0.4 mg under the tongue every 5 (five) minutes as needed for chest pain.     omeprazole (PRILOSEC) 20 MG capsule TAKE 1 CAPSULE BY MOUTH EVERY DAY 30 MINUTES BEFORE BREAKFAST 90 capsule 3   potassium chloride SA (KLOR-CON M) 20 MEQ tablet Take 20 mEq by mouth daily.     No current facility-administered medications for this visit.     Past Surgical History:  Procedure Laterality Date   ABDOMINAL HYSTERECTOMY     CARDIAC CATHETERIZATION N/A 05/14/2016   Procedure: Left Heart  Cath and Coronary Angiography;  Surgeon: Burnell Blanks, MD;  Location: [facility];  Service: Cardiovascular;  Laterality: N/A;   CARDIAC CATHETERIZATION N/A 05/14/2016   Procedure: Coronary Stent Intervention;  Surgeon: Burnell Blanks, MD;  Location: [facility];  Service: Cardiovascular;  Laterality: N/A;   CHOLECYSTECTOMY     AGE 34   COLONOSCOPY N/A 12/25/2018   Procedure: COLONOSCOPY;  Surgeon: Danie Binder, MD;  [facility];  Service: Endoscopy;  Laterality: N/A;  2:15pm   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    POLYPECTOMY  12/25/2018   Procedure: POLYPECTOMY;  Surgeon: Danie Binder, MD;  [facility];  Service: Endoscopy;;     Allergies  Allergen Reactions   Bee Venom Anaphylaxis and Swelling   Halls Cough Drops [Menthol] Shortness Of Breath    Lisinopril Swelling   Atorvastatin    Codeine Nausea And Vomiting   Erythromycin    Milk-Related Compounds Other (See Comments)    Rash in inside of mouth , digestive issues   Morphine And Related Nausea And Vomiting   Niacin And Related    Other     PT CAN NOT BE AROUND HALLS COUGH DROPS -  IT TAKES HER BREATH AWAY    Statins       Family History  Problem Relation Age of Onset   Heart Problems Mother    Stroke Father    Diabetes Father    Heart attack Maternal Grandfather    Heart attack Brother    Heart attack Brother    Colon cancer Neg Hx    Colon polyps Neg Hx    Breast cancer Neg Hx      Social History Ms. Stepanian reports that she has never smoked. She has never used smokeless tobacco. Ms. Paxson reports no history of alcohol use.   Review of Systems CONSTITUTIONAL: No weight loss, fever, chills, weakness or fatigue.  HEENT: Eyes: No visual loss, blurred vision, double vision or yellow sclerae.No hearing loss, sneezing, congestion, runny nose or sore throat.  SKIN: No rash or itching.  CARDIOVASCULAR: per hpi RESPIRATORY: No shortness of breath, cough or sputum.  GASTROINTESTINAL: No anorexia, nausea, vomiting or diarrhea. No abdominal pain or blood.  GENITOURINARY: No burning on urination, no polyuria NEUROLOGICAL: No headache, dizziness, syncope, paralysis, ataxia, numbness or tingling in the extremities. No change in bowel or bladder control.  MUSCULOSKELETAL: No muscle, back pain, joint pain or stiffness.  LYMPHATICS: No enlarged nodes. No history of splenectomy.  PSYCHIATRIC: No history of depression or anxiety.  ENDOCRINOLOGIC: No reports of sweating, cold or heat intolerance. No polyuria or polydipsia.  Marland Kitchen   Physical Examination Today's Vitals   06/01/22 1435  BP: (!) 146/80  Pulse: 64  SpO2: 95%  Weight: 172 lb 12.8 oz (78.4 kg)  Height: '5\' 8"'$  (1.727 m)   Body mass index is 26.27 kg/m.  Gen: resting comfortably, no acute distress HEENT: no  scleral icterus, pupils equal round and reactive, no palptable cervical adenopathy,  CV: RRR, no mrg, no jvd Resp: Clear to auscultation bilaterally GI: abdomen is soft, non-tender, non-distended, normal bowel sounds, no hepatosplenomegaly MSK: extremities are warm, no edema.  Skin: warm, no rash Neuro:  no focal deficits Psych: appropriate affect   Diagnostic Studies  05/2016 cath   Prox RCA to Mid RCA lesion, 40 %stenosed. Ost Ramus lesion, 30 %stenosed. A STENT PROMUS PREM MR 2.25X16 drug eluting  stent was successfully placed. Ramus lesion, 99 %stenosed. Post intervention, there is a 0% residual stenosis. 1st Diag lesion, 70 %stenosed. The left ventricular systolic function is normal. LV end diastolic pressure is normal. The left ventricular ejection fraction is 55-65% by visual estimate. There is no mitral valve regurgitation.   1. Severe stenosis mid Ramus intermediate Katlin Bortner, culprit for NSTEMI 2. Mild non-obstructive disease in the RCA.  3. Moderate stenosis small caliber Diagonal Deven Furia 4. Normal LV systolic function 5. Successful PTCA/DES x 1 mid Ramus intermediate Sade Mehlhoff   Recommendations: Continue DAPT with ASA and Brilinta for one year. She is statin intolerant. Consider PCSK9-inh after discharge. Start beta blocker. Follow up in [facility] office.    Assessment and Plan   1. CAD - no recent symptmos, continue current meds     2. Hyperlipidemia - intolerant to statins, has been on zetia with very high LDL. Reluctant for pcsk9i's but now open to starting, refer to lipid clinic     3. HTN - elevated in clinic, we will increase norvasc to 7.'5mg'$  daily. Careful titration due to prior issues with low bp's.     Arnoldo Lenis, M.D.,

## 2022-06-22 ENCOUNTER — Encounter: Payer: Self-pay | Admitting: Pharmacist

## 2022-06-22 ENCOUNTER — Ambulatory Visit: Payer: PPO | Attending: Cardiology | Admitting: Pharmacist

## 2022-06-22 ENCOUNTER — Telehealth: Payer: Self-pay | Admitting: Pharmacist

## 2022-06-22 DIAGNOSIS — I214 Non-ST elevation (NSTEMI) myocardial infarction: Secondary | ICD-10-CM | POA: Diagnosis not present

## 2022-06-22 DIAGNOSIS — T466X5A Adverse effect of antihyperlipidemic and antiarteriosclerotic drugs, initial encounter: Secondary | ICD-10-CM | POA: Insufficient documentation

## 2022-06-22 DIAGNOSIS — Z9861 Coronary angioplasty status: Secondary | ICD-10-CM

## 2022-06-22 DIAGNOSIS — E785 Hyperlipidemia, unspecified: Secondary | ICD-10-CM | POA: Diagnosis not present

## 2022-06-22 DIAGNOSIS — I2510 Atherosclerotic heart disease of native coronary artery without angina pectoris: Secondary | ICD-10-CM

## 2022-06-22 DIAGNOSIS — M791 Myalgia, unspecified site: Secondary | ICD-10-CM | POA: Diagnosis not present

## 2022-06-22 NOTE — Telephone Encounter (Signed)
PA for Repatha submitted.  Key: [id number]

## 2022-06-22 NOTE — Patient Instructions (Signed)
It was nice meeting you today  We would like your LDL (bad cholesterol) to be less than 70  Please continue your ezetimibe '10mg'$  daily  We would start you on a new medication called Repatha which you will inject once every 2 weeks  I will complete the prior authorization for you and contact you when it is approved  Once you start the medication we will recheck your cholesterol in 2-3 months  Karren Cobble, PharmD, [place], [place], [address] [place], [place], [place], [address] Phone: [phone], Fax: [phone]

## 2022-06-22 NOTE — Progress Notes (Signed)
Patient ID: Tricia Savage                 DOB: 1946-03-14                    MRN: [id number]      HPI: Tricia Savage is a 76 y.o. female patient referred to lipid clinic by Dr Harl Bowie. PMH is significant for NSTEMI, CAD, HTN and HLD.  Patient is intolerant to statins and has multiple medication intolerances.  Patient presents today in good spirits. Has tried multiple statins but does not remember their names. Also reports swelling on Niacin. Currently managed on Zetia   Had NSTEMI in 2017. Recommended to start PCSK9i however patient was hesitant.  Patient was enrolled in the CLEAR for bempedoic acid.    Current Medications:  Zetia '10mg'$  daily  Intolerances:  Statins Niacin  Risk Factors:  CAD Hx of NSTEMI HLD  LDL goal: <70  Labs: TC 235, HDL 42, Trigs 224, LDL 155 (02/22/22 on Zetia)  Past Medical History:  Diagnosis Date   Atrial fibrillation ([place])    a. occurred in 2000, never placed on anti-coagulation. No known recurrence.    CAD (coronary artery disease)    a. cath 05/2016: 99% stenosis RI (DES placed), mild non-obstructive disease RCA, 70% along small caliber diagonal branch   HLD (hyperlipidemia)    a. intoelrant to statin therapy   Hypertension    Trigeminal neuralgia     Current Outpatient Medications on File Prior to Visit  Medication Sig Dispense Refill   amLODipine (NORVASC) 5 MG tablet Take 1.5 tablets (7.5 mg total) by mouth daily. 135 tablet 1   aspirin EC 81 MG EC tablet Take 1 tablet (81 mg total) by mouth daily.     carbamazepine (CARBATROL) 200 MG 12 hr capsule Take 200 mg by mouth 2 (two) times daily as needed (FACIAL PAIN).      ezetimibe (ZETIA) 10 MG tablet TAKE 1 TABLET(10 MG) BY MOUTH DAILY 90 tablet 2   hydrochlorothiazide (HYDRODIURIL) 25 MG tablet Take 12.5 mg by mouth daily.      loratadine (CLARITIN) 10 MG tablet Take 10 mg by mouth daily as needed for allergies.     metoprolol tartrate (LOPRESSOR) 25 MG tablet Take 1 tablet (25 mg total) by  mouth in the morning. and 1/2 in the evening 45 tablet 6   nitroGLYCERIN (NITROSTAT) 0.4 MG SL tablet Place 0.4 mg under the tongue every 5 (five) minutes as needed for chest pain.     omeprazole (PRILOSEC) 20 MG capsule TAKE 1 CAPSULE BY MOUTH EVERY DAY 30 MINUTES BEFORE BREAKFAST 90 capsule 3   potassium chloride SA (KLOR-CON M) 20 MEQ tablet Take 20 mEq by mouth daily.     No current facility-administered medications on file prior to visit.    Allergies  Allergen Reactions   Bee Venom Anaphylaxis and Swelling   Halls Cough Drops [Menthol] Shortness Of Breath   Lisinopril Swelling   Atorvastatin    Codeine Nausea And Vomiting   Erythromycin    Milk-Related Compounds Other (See Comments)    Rash in inside of mouth , digestive issues   Morphine And Related Nausea And Vomiting   Niacin And Related    Other     PT CAN NOT BE AROUND HALLS COUGH DROPS -  IT TAKES HER BREATH AWAY    Statins     Assessment/Plan:  1. Hyperlipidemia - Patient most recent LDL 155 which is  above goal of <70. Since patient intolerant to statins, recommend starting PCSK9i.    Using [employer], educated patient on mechanism of action, storage, site selection, administration and possible adverse effects. Patient was able to demonstrate in room. Will complete PA and contact patient when approved. Recheck lipid panel in 2-3 months at [facility].    Continue Zetia '10mg'$  daily Start Repatha '140mg'$  q 2 weeks Recheck lipid panel in 2-3 months  Karren Cobble, PharmD, [place], [place], [place], [place], [place], [address] Phone: [phone], Fax: [phone]

## 2022-06-23 ENCOUNTER — Encounter: Payer: Self-pay | Admitting: Pharmacist

## 2022-06-23 MED ORDER — REPATHA SURECLICK 140 MG/ML ~~LOC~~ SOAJ: 1.0000 mL | SUBCUTANEOUS | 11 refills | Status: DC

## 2022-06-23 NOTE — Telephone Encounter (Signed)
PA for Repatha approved through 12/21/22

## 2022-06-23 NOTE — Addendum Note (Signed)
Addended by: Rollen Sox on: 06/23/2022 07:43 AM   Modules accepted: Orders

## 2022-06-29 ENCOUNTER — Other Ambulatory Visit: Payer: Self-pay | Admitting: Cardiology

## 2022-07-09 ENCOUNTER — Encounter: Payer: Self-pay | Admitting: Cardiology

## 2022-07-12 NOTE — Telephone Encounter (Signed)
Not a common side effect of repatha. Has she ever had that type of pain before? Where exactly is she feeling the pain   Zandra Abts MD

## 2022-07-22 ENCOUNTER — Other Ambulatory Visit: Payer: Self-pay | Admitting: Cardiology

## 2022-12-14 ENCOUNTER — Ambulatory Visit: Payer: HMO | Attending: Cardiology | Admitting: Cardiology

## 2022-12-14 ENCOUNTER — Encounter: Payer: Self-pay | Admitting: Cardiology

## 2022-12-14 VITALS — BP 130/88 | HR 64 | Ht 68.0 in | Wt 167.8 lb

## 2022-12-14 DIAGNOSIS — Z9861 Coronary angioplasty status: Secondary | ICD-10-CM | POA: Diagnosis not present

## 2022-12-14 DIAGNOSIS — E782 Mixed hyperlipidemia: Secondary | ICD-10-CM

## 2022-12-14 DIAGNOSIS — I2510 Atherosclerotic heart disease of native coronary artery without angina pectoris: Secondary | ICD-10-CM | POA: Diagnosis not present

## 2022-12-14 DIAGNOSIS — I10 Essential (primary) hypertension: Secondary | ICD-10-CM | POA: Diagnosis not present

## 2022-12-14 MED ORDER — HYDROCHLOROTHIAZIDE 25 MG PO TABS: 12.5000 mg | ORAL_TABLET | Freq: Every day | ORAL | 1 refills | Status: DC

## 2022-12-14 MED ORDER — AMLODIPINE BESYLATE 5 MG PO TABS: 5.0000 mg | ORAL_TABLET | Freq: Every day | ORAL | 1 refills | Status: DC

## 2022-12-14 MED ORDER — METOPROLOL TARTRATE 25 MG PO TABS: ORAL_TABLET | ORAL | 1 refills | Status: DC

## 2022-12-14 MED ORDER — POTASSIUM CHLORIDE CRYS ER 20 MEQ PO TBCR: 20.0000 meq | EXTENDED_RELEASE_TABLET | Freq: Every day | ORAL | 1 refills | Status: DC

## 2022-12-14 MED ORDER — EZETIMIBE 10 MG PO TABS: ORAL_TABLET | ORAL | 1 refills | Status: DC

## 2022-12-14 MED ORDER — NITROGLYCERIN 0.4 MG SL SUBL: 0.4000 mg | SUBLINGUAL_TABLET | SUBLINGUAL | 3 refills | Status: AC | PRN

## 2022-12-14 NOTE — Progress Notes (Signed)
Clinical Summary Ms. Hsieh is a 77 y.o.female seen today for follow up of the following medical problems.    1. CAD - admit NSTEMI 05/2016, received DES to ramus. Echo at that time LVEF 55-60%, basalinferior hypokinesis.  - beta blocker limited due to bradycardia - lisinopirl stopped due to tongue swelling during ER visit 11/2017.   - slight nonspecific stinging pains - no exertional chest pains.   - compliant with meds      2. Hyperlipidemia - statin intolerant. Started on zetia during 05/2016 admission.  - part of [employer] research study - reluctant for pcski9i, but more open to it now.      - referred to lipid clinic, was to start repatha  -on repatha cold like symptoms, followed by severe muscle - symptoms starting to resolve   3. HTN - home bp's usually around 120s-140s/70-80s - she reports pcp lowered meds prevoiusly due to low bp's.    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    Past Medical History:  Diagnosis Date   Atrial fibrillation ([place])    a. occurred in 2000, never placed on anti-coagulation. No known recurrence.    CAD (coronary artery disease)    a. cath 05/2016: 99% stenosis RI (DES placed), mild non-obstructive disease RCA, 70% along small caliber diagonal Sirius Woodford   HLD (hyperlipidemia)    a. intoelrant to statin therapy   Hypertension    Trigeminal neuralgia      Allergies  Allergen Reactions   Bee Venom Anaphylaxis and Swelling   Halls Cough Drops [Menthol] Shortness Of Breath   Lisinopril Swelling   Atorvastatin    Codeine Nausea And Vomiting   Erythromycin    Milk-Related Compounds Other (See Comments)    Rash in inside of mouth , digestive issues   Morphine And Related Nausea And Vomiting   Niacin And Related Swelling   Other     PT CAN NOT BE AROUND HALLS COUGH DROPS -  IT TAKES HER BREATH AWAY    Statins      Current Outpatient Medications  Medication Sig Dispense  Refill   amLODipine (NORVASC) 5 MG tablet Take 1.5 tablets (7.5 mg total) by mouth daily. 135 tablet 1   aspirin EC 81 MG EC tablet Take 1 tablet (81 mg total) by mouth daily.     carbamazepine (CARBATROL) 200 MG 12 hr capsule Take 200 mg by mouth 2 (two) times daily as needed (FACIAL PAIN).      Evolocumab (REPATHA SURECLICK) [id number] MG/ML SOAJ Inject 1 mL into the skin every 14 (fourteen) days. 2 mL 11   ezetimibe (ZETIA) 10 MG tablet TAKE 1 TABLET(10 MG) BY MOUTH DAILY 90 tablet 2   hydrochlorothiazide (HYDRODIURIL) 25 MG tablet Take 12.5 mg by mouth daily.      loratadine (CLARITIN) 10 MG tablet Take 10 mg by mouth daily as needed for allergies.     metoprolol tartrate (LOPRESSOR) 25 MG tablet TAKE 1 TABLET BY MOUTH EVERY MORNING AND 1/2 TABLET IN THE EVENING 45 tablet 6   nitroGLYCERIN (NITROSTAT) 0.4 MG SL tablet Place 0.4 mg under the tongue every 5 (five) minutes as needed for chest pain.     omeprazole (PRILOSEC) 20 MG capsule TAKE 1 CAPSULE BY MOUTH EVERY DAY 30 MINUTES BEFORE BREAKFAST 90 capsule 3   potassium chloride SA (KLOR-CON M) 20 MEQ tablet Take 20 mEq by  mouth daily.     No current facility-administered medications for this visit.     Past Surgical History:  Procedure Laterality Date   ABDOMINAL HYSTERECTOMY     CARDIAC CATHETERIZATION N/A 05/14/2016   Procedure: Left Heart Cath and Coronary Angiography;  Surgeon: Burnell Blanks, MD;  Location: [place];  Service: Cardiovascular;  Laterality: N/A;   CARDIAC CATHETERIZATION N/A 05/14/2016   Procedure: Coronary Stent Intervention;  Surgeon: Burnell Blanks, MD;  Location: [place] [facility];  Service: Cardiovascular;  Laterality: N/A;   CHOLECYSTECTOMY     AGE 62   COLONOSCOPY N/A 12/25/2018   Procedure: COLONOSCOPY;  Surgeon: Danie Binder, MD;  [facility];  Service: Endoscopy;  Laterality: N/A;  2:15pm   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    POLYPECTOMY   12/25/2018   Procedure: POLYPECTOMY;  Surgeon: Danie Binder, MD;  [facility];  Service: Endoscopy;;     Allergies  Allergen Reactions   Bee Venom Anaphylaxis and Swelling   Halls Cough Drops [Menthol] Shortness Of Breath   Lisinopril Swelling   Atorvastatin    Codeine Nausea And Vomiting   Erythromycin    Milk-Related Compounds Other (See Comments)    Rash in inside of mouth , digestive issues   Morphine And Related Nausea And Vomiting   Niacin And Related Swelling   Other     PT CAN NOT BE AROUND HALLS COUGH DROPS -  IT TAKES HER BREATH AWAY    Statins       Family History  Problem Relation Age of Onset   Heart Problems Mother    Stroke Father    Diabetes Father    Heart attack Maternal Grandfather    Heart attack Brother    Heart attack Brother    Colon cancer Neg Hx    Colon polyps Neg Hx    Breast cancer Neg Hx      Social History Ms. Calabro reports that she has never smoked. She has never used smokeless tobacco. Ms. Mountford reports no history of alcohol use.   Review of Systems CONSTITUTIONAL: No weight loss, fever, chills, weakness or fatigue.  HEENT: Eyes: No visual loss, blurred vision, double vision or yellow sclerae.No hearing loss, sneezing, congestion, runny nose or sore throat.  SKIN: No rash or itching.  CARDIOVASCULAR: per hpi RESPIRATORY: No shortness of breath, cough or sputum.  GASTROINTESTINAL: No anorexia, nausea, vomiting or diarrhea. No abdominal pain or blood.  GENITOURINARY: No burning on urination, no polyuria NEUROLOGICAL: No headache, dizziness, syncope, paralysis, ataxia, numbness or tingling in the extremities. No change in bowel or bladder control.  MUSCULOSKELETAL: No muscle, back pain, joint pain or stiffness.  LYMPHATICS: No enlarged nodes. No history of splenectomy.  PSYCHIATRIC: No history of depression or anxiety.  ENDOCRINOLOGIC: No reports of sweating, cold or heat intolerance. No polyuria or polydipsia.   Marland Kitchen   Physical Examination Today's Vitals   12/14/22 1526  BP: 130/88  Pulse: 64  SpO2: 95%  Weight: 167 lb 12.8 oz (76.1 kg)  Height: '5\' 8"'$  (1.727 m)   Body mass index is 25.51 kg/m.  Gen: resting comfortably, no acute distress HEENT: no scleral icterus, pupils equal round and reactive, no palptable cervical adenopathy,  CV: RRR, no m/rg no jvd Resp: Clear to auscultation bilaterally GI: abdomen is soft, non-tender, non-distended, normal bowel sounds, no hepatosplenomegaly MSK: extremities are warm, no edema.  Skin: warm,  no rash Neuro:  no focal deficits Psych: appropriate affect   Diagnostic Studies  05/2016 cath   Prox RCA to Mid RCA lesion, 40 %stenosed. Ost Ramus lesion, 30 %stenosed. A STENT PROMUS PREM MR 2.25X16 drug eluting stent was successfully placed. Ramus lesion, 99 %stenosed. Post intervention, there is a 0% residual stenosis. 1st Diag lesion, 70 %stenosed. The left ventricular systolic function is normal. LV end diastolic pressure is normal. The left ventricular ejection fraction is 55-65% by visual estimate. There is no mitral valve regurgitation.   1. Severe stenosis mid Ramus intermediate Chester Sibert, culprit for NSTEMI 2. Mild non-obstructive disease in the RCA.  3. Moderate stenosis small caliber Diagonal Pauline Trainer 4. Normal LV systolic function 5. Successful PTCA/DES x 1 mid Ramus intermediate Ishan Sanroman   Recommendations: Continue DAPT with ASA and Brilinta for one year. She is statin intolerant. Consider PCSK9-inh after discharge. Start beta blocker. Follow up in [facility] office.    Assessment and Plan  1. CAD -no cardiac symptoms, continue current meds     2. Hyperlipidemia - intolerant to statins, has been on zetia with very high LDL. - reports side effects on repatha with leg pains - reach out to lipid clinic for any other options   3. HTN - at goal, continue current meds   F/u 6 months      Arnoldo Lenis,  M.D.

## 2022-12-14 NOTE — Patient Instructions (Signed)
Medication Instructions:  Repatha removed from list today.  Continue all other medications.     Labwork: none  Testing/Procedures: none  Follow-Up: 6 months   Any Other Special Instructions Will Be Listed Below (If Applicable).   If you need a refill on your cardiac medications before your next appointment, please call your pharmacy.

## 2023-01-30 ENCOUNTER — Other Ambulatory Visit: Payer: Self-pay | Admitting: Cardiology

## 2023-02-17 ENCOUNTER — Encounter: Payer: Self-pay | Admitting: Internal Medicine

## 2023-06-23 ENCOUNTER — Encounter: Payer: Self-pay | Admitting: Cardiology

## 2023-06-23 ENCOUNTER — Ambulatory Visit: Payer: HMO | Attending: Cardiology | Admitting: Cardiology

## 2023-06-23 VITALS — BP 150/90 | HR 62 | Ht 68.0 in | Wt 171.0 lb

## 2023-06-23 DIAGNOSIS — Z9861 Coronary angioplasty status: Secondary | ICD-10-CM

## 2023-06-23 DIAGNOSIS — E782 Mixed hyperlipidemia: Secondary | ICD-10-CM | POA: Diagnosis not present

## 2023-06-23 DIAGNOSIS — I10 Essential (primary) hypertension: Secondary | ICD-10-CM | POA: Diagnosis not present

## 2023-06-23 DIAGNOSIS — I2510 Atherosclerotic heart disease of native coronary artery without angina pectoris: Secondary | ICD-10-CM | POA: Diagnosis not present

## 2023-06-23 NOTE — Progress Notes (Signed)
Clinical Summary Ms. Germain is a 77 y.o.female seen today for follow up of the following medical problems.    1. CAD - admit NSTEMI 05/2016, received DES to ramus. Echo at that time LVEF 55-60%, basalinferior hypokinesis.  - beta blocker limited due to bradycardia - lisinopirl stopped due to tongue swelling during ER visit 11/2017.      - recent chest pains. Right sided, dull pain. 2/10 in severity. At rest or with activity. Not positional. No other associated sypmtoms. Would last a minutes. Subsiding, decreasing frequency.  - EKG today NSR, no ischemic changes     2. Hyperlipidemia - statin intolerant. Started on zetia during 05/2016 admission.  - part of CLEAR research study - reluctant for pcski9i, but more open to it now.      - referred to lipid clinic, was to start repatha   -on repatha cold like symptoms, followed by severe muscle - symptoms starting to resolve. She reports long lingering symptoms  - not intersted in trying leqvio.      3. HTN - she reports pcp lowered meds prevoiusly due to low bp's.   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  Past Medical History:  Diagnosis Date   Atrial fibrillation (HCC)    a. occurred in 2000, never placed on anti-coagulation. No known recurrence.    CAD (coronary artery disease)    a. cath 05/2016: 99% stenosis RI (DES placed), mild non-obstructive disease RCA, 70% along small caliber diagonal Amya Hlad   HLD (hyperlipidemia)    a. intoelrant to statin therapy   Hypertension    Trigeminal neuralgia      Allergies  Allergen Reactions   Bee Venom Anaphylaxis and Swelling   Halls Cough Drops [Menthol] Shortness Of Breath   Lisinopril Swelling   Atorvastatin    Codeine Nausea And Vomiting   Erythromycin    Milk-Related Compounds Other (See Comments)    Rash in inside of mouth , digestive issues   Morphine And Codeine Nausea And Vomiting   Niacin And Related  Swelling   Other     PT CAN NOT BE AROUND HALLS COUGH DROPS -  IT TAKES HER BREATH AWAY    Statins      Current Outpatient Medications  Medication Sig Dispense Refill   amLODipine (NORVASC) 5 MG tablet TAKE 1 TABLET(5 MG) BY MOUTH DAILY 90 tablet 1   aspirin EC 81 MG EC tablet Take 1 tablet (81 mg total) by mouth daily.     carbamazepine (CARBATROL) 200 MG 12 hr capsule Take 200 mg by mouth 2 (two) times daily as needed (FACIAL PAIN).      cholestyramine light (PREVALITE) 4 g packet Take 1 packet by mouth daily.     ezetimibe (ZETIA) 10 MG tablet TAKE 1 TABLET(10 MG) BY MOUTH DAILY 90 tablet 1   hydrochlorothiazide (HYDRODIURIL) 25 MG tablet Take 0.5 tablets (12.5 mg total) by mouth daily. 45 tablet 1   loratadine (CLARITIN) 10 MG tablet Take 10 mg by mouth daily as needed for allergies.     metoprolol tartrate (LOPRESSOR) 25 MG tablet TAKE 1 TABLET BY MOUTH EVERY MORNING AND 1/2 TABLET IN THE EVENING 135 tablet 1   nitroGLYCERIN (NITROSTAT) 0.4 MG SL tablet Place 1 tablet (0.4 mg total) under the tongue every 5 (five) minutes x 3 doses as needed for chest pain (up to 3 doses, If  no relief after 3rd dose, proceed to ED or call 911). 25 tablet 3   omeprazole (PRILOSEC) 20 MG capsule TAKE 1 CAPSULE BY MOUTH EVERY DAY 30 MINUTES BEFORE BREAKFAST 90 capsule 3   potassium chloride SA (KLOR-CON M) 20 MEQ tablet Take 1 tablet (20 mEq total) by mouth daily. 90 tablet 1   No current facility-administered medications for this visit.     Past Surgical History:  Procedure Laterality Date   ABDOMINAL HYSTERECTOMY     CARDIAC CATHETERIZATION N/A 05/14/2016   Procedure: Left Heart Cath and Coronary Angiography;  Surgeon: Kathleene Hazel, MD;  [facility]: [facility] INVASIVE CV LAB;  Service: Cardiovascular;  Laterality: N/A;   CARDIAC CATHETERIZATION N/A 05/14/2016   Procedure: Coronary Stent Intervention;  Surgeon: Kathleene Hazel, MD;  Location: [facility] INVASIVE CV LAB;  Service: Cardiovascular;   Laterality: N/A;   CHOLECYSTECTOMY     AGE 67   COLONOSCOPY N/A 12/25/2018   Procedure: COLONOSCOPY;  Surgeon: West Bali, MD;  Location: [facility]O SU[facility];  Service: Endoscopy;  Laterality: N/A;  2:15pm   left index finger amputation  1978   cut by bandsaw at work.    POLYPECTOMY  12/25/2018   Procedure: POLYPECTOMY;  Surgeon: West Bali, MD;  Location: [facility] END[facility] SUITE;  Service: Endoscopy;;     Allergies  Allergen Reactions   Bee Venom Anaphylaxis and Swelling   Halls Cough Drops [Menthol] Shortness Of Breath   Lisinopril Swelling   Atorvastatin    Codeine Nausea And Vomiting   Erythromycin    Milk-Related Compounds Other (See Comments)    Rash in inside of mouth , digestive issues   Morphine And Codeine Nausea And Vomiting   Niacin And Related Swelling   Other     PT CAN NOT BE AROUND HALLS COUGH DROPS -  IT TAKES HER BREATH AWAY    Statins       Family History  Problem Relation Age of Onset   Heart Problems Mother    Stroke Father    Diabetes Father    Heart attack Maternal Grandfather    Heart attack Brother    Heart attack Brother    Colon cancer Neg Hx    Colon polyps Neg Hx    Breast cancer Neg Hx      Social History Ms. Schwallie reports that she has never smoked. She has never used smokeless tobacco. Ms. Chiou reports no history of alcohol use.   Review of Systems CONSTITUTIONAL: No weight loss, fever, chills, weakness or fatigue.  HEENT: Eyes: No visual loss, blurred vision, double vision or yellow sclerae.No hearing loss, sneezing, congestion, runny nose or sore throat.  SKIN: No rash or itching.  CARDIOVASCULAR: per hpi RESPIRATORY: No shortness of breath, cough or sputum.  GASTROINTESTINAL: No anorexia, nausea, vomiting or diarrhea. No abdominal pain or blood.  GENITOURINARY: No burning on urination, no polyuria NEUROLOGICAL: No headache, dizziness, syncope, paralysis, ataxia, numbness or tingling in the extremities. No change in bowel or bladder  control.  MUSCULOSKELETAL: No muscle, back pain, joint pain or stiffness.  LYMPHATICS: No enlarged nodes. No history of splenectomy.  PSYCHIATRIC: No history of depression or anxiety.  ENDOCRINOLOGIC: No reports of sweating, cold or heat intolerance. No polyuria or polydipsia.  Marland Kitchen   Physical Examination Today's Vitals   06/23/23 1602  BP: 138/88  Pulse: 62  SpO2: 97%  Weight: 171 lb (77.6 kg)  Height: 5\' 8"  (1.727 m)   Body mass index is 26 kg/m.  Gen: resting comfortably, no acute distress HEENT: no scleral icterus, pupils equal round and reactive, no palptable cervical adenopathy,  CV: RRR, no m/rg, no jvd Resp: Clear to auscultation bilaterally GI: abdomen is soft, non-tender, non-distended, normal bowel sounds, no hepatosplenomegaly MSK: extremities are warm, no edema.  Skin: warm, no rash Neuro:  no focal deficits Psych: appropriate affect   Diagnostic Studies 05/2016 cath   Prox RCA to Mid RCA lesion, 40 %stenosed. Ost Ramus lesion, 30 %stenosed. A STENT PROMUS PREM MR 2.25X16 drug eluting stent was successfully placed. Ramus lesion, 99 %stenosed. Post intervention, there is a 0% residual stenosis. 1st Diag lesion, 70 %stenosed. The left ventricular systolic function is normal. LV end diastolic pressure is normal. The left ventricular ejection fraction is 55-65% by visual estimate. There is no mitral valve regurgitation.   1. Severe stenosis mid Ramus intermediate Roselyne Stalnaker, culprit for NSTEMI 2. Mild non-obstructive disease in the RCA.  3. Moderate stenosis small caliber Diagonal Ramari Bray 4. Normal LV systolic function 5. Successful PTCA/DES x 1 mid Ramus intermediate Matti Minney   Recommendations: Continue DAPT with ASA and Brilinta for one year. She is statin intolerant. Consider PCSK9-inh after discharge. Start beta blocker. Follow up in [facility] office.       Assessment and Plan   1. CAD -recent atypical symptoms are resolving, continue  current meds. No plans for ishcemic testing at this time.      2. Hyperlipidemia - intolerant to statins, has been on zetia with very high LDL. - intolerant to repatha - not interested in tryin leqvio at this time - continue current therapy.    3. HTN - elevated bp, she will call us Monday with home bp's. Room to titrate norvasc if needed.     Antoine Poche, M.D

## 2023-06-23 NOTE — Patient Instructions (Addendum)
Medication Instructions:   Continue all current medications.   Labwork:  none  Testing/Procedures:  none  Follow-Up:  6 months   Any Other Special Instructions Will Be Listed Below (If Applicable).  Please call on Monday with update on BP readings.   If you need a refill on your cardiac medications before your next appointment, please call your pharmacy.

## 2023-07-15 ENCOUNTER — Ambulatory Visit: Payer: HMO | Admitting: Internal Medicine

## 2023-08-04 ENCOUNTER — Encounter: Payer: Self-pay | Admitting: Internal Medicine

## 2023-08-04 ENCOUNTER — Ambulatory Visit (INDEPENDENT_AMBULATORY_CARE_PROVIDER_SITE_OTHER): Payer: HMO | Admitting: Internal Medicine

## 2023-08-04 VITALS — BP 148/86 | HR 62 | Resp 16 | Ht 68.0 in | Wt 170.0 lb

## 2023-08-04 DIAGNOSIS — Z1231 Encounter for screening mammogram for malignant neoplasm of breast: Secondary | ICD-10-CM | POA: Insufficient documentation

## 2023-08-04 DIAGNOSIS — I2510 Atherosclerotic heart disease of native coronary artery without angina pectoris: Secondary | ICD-10-CM

## 2023-08-04 DIAGNOSIS — M8580 Other specified disorders of bone density and structure, unspecified site: Secondary | ICD-10-CM | POA: Insufficient documentation

## 2023-08-04 DIAGNOSIS — Z9861 Coronary angioplasty status: Secondary | ICD-10-CM

## 2023-08-04 DIAGNOSIS — Z1329 Encounter for screening for other suspected endocrine disorder: Secondary | ICD-10-CM

## 2023-08-04 DIAGNOSIS — E785 Hyperlipidemia, unspecified: Secondary | ICD-10-CM

## 2023-08-04 DIAGNOSIS — Z23 Encounter for immunization: Secondary | ICD-10-CM | POA: Diagnosis not present

## 2023-08-04 DIAGNOSIS — I10 Essential (primary) hypertension: Secondary | ICD-10-CM | POA: Diagnosis not present

## 2023-08-04 DIAGNOSIS — K219 Gastro-esophageal reflux disease without esophagitis: Secondary | ICD-10-CM | POA: Insufficient documentation

## 2023-08-04 NOTE — Assessment & Plan Note (Signed)
History of intolerance to both statins and Repatha.  She is currently prescribed Zetia and cholestyramine.

## 2023-08-04 NOTE — Assessment & Plan Note (Signed)
Influenza vaccine administered today.

## 2023-08-04 NOTE — Patient Instructions (Signed)
It was a pleasure to see you today.  Thank you for giving [place] the opportunity to be involved in your care.  Below is a brief recap of your visit and next steps.  We will plan to see you again in 6 months.  Summary You have established care today No medication changes have been made Repeat labs ordered Mammogram ordered Follow up in 6 months Flu shot today

## 2023-08-04 NOTE — Assessment & Plan Note (Signed)
T-score -1.2 on DEXA from 2015. -Repeat vitamin D level ordered today

## 2023-08-04 NOTE — Progress Notes (Signed)
New Patient Office Visit  Subjective    Patient ID: Tricia Savage, female    DOB: 1946-06-24  Age: 77 y.o. MRN: [id number]  CC:  Chief Complaint  Patient presents with   Establish Care   hand swelling     Injured her hand back in the 70s and its recently started swelling and clicks when she is working     HPI Tricia Savage presents to establish care.  She is a 77 year old woman who endorses a past medical history significant for CAD s/p PCI (2017), HTN, HLD, and GERD.  Previously followed by Dr. Sudie Bailey.  [name] reports feeling well today.  She endorses chronic pain in her left thumb but is otherwise asymptomatic and has no acute concerns to discuss aside from desiring to establish care.  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  She denies tobacco, alcohol, and illicit drug use.  Her family medical history is significant for diabetes mellitus, CVA, CHF, and CAD.  Chronic medical conditions and outstanding preventative care items discussed today are individually addressed in A/P below.   Outpatient Encounter Medications as of 08/04/2023  Medication Sig   amLODipine (NORVASC) 5 MG tablet TAKE 1 TABLET(5 MG) BY MOUTH DAILY   aspirin EC 81 MG EC tablet Take 1 tablet (81 mg total) by mouth daily.   ezetimibe (ZETIA) 10 MG tablet TAKE 1 TABLET(10 MG) BY MOUTH DAILY   hydrochlorothiazide (HYDRODIURIL) 25 MG tablet Take 0.5 tablets (12.5 mg total) by mouth daily.   ibuprofen (ADVIL) 200 MG tablet Take 200 mg by mouth every 6 (six) hours as needed. 600 mg   loratadine (CLARITIN) 10 MG tablet Take 10 mg by mouth daily as needed for allergies.   metoprolol tartrate (LOPRESSOR) 25 MG tablet TAKE 1 TABLET BY MOUTH EVERY MORNING AND 1/2 TABLET IN THE EVENING   nitroGLYCERIN (NITROSTAT) 0.4 MG SL tablet Place 1 tablet (0.4 mg total) under the tongue every 5 (five) minutes x 3 doses as needed for chest pain (up to 3 doses, If no relief after 3rd dose, proceed to ED or call 911).    omeprazole (PRILOSEC) 20 MG capsule TAKE 1 CAPSULE BY MOUTH EVERY DAY 30 MINUTES BEFORE BREAKFAST   potassium chloride SA (KLOR-CON M) 20 MEQ tablet Take 1 tablet (20 mEq total) by mouth daily.   carbamazepine (CARBATROL) 200 MG 12 hr capsule Take 200 mg by mouth 2 (two) times daily as needed (FACIAL PAIN).  (Patient not taking: Reported on 08/04/2023)   [DISCONTINUED] cholestyramine light (PREVALITE) 4 g packet Take 1 packet by mouth daily.   No facility-administered encounter medications on file as of 08/04/2023.    Past Medical History:  Diagnosis Date   Atrial fibrillation (HCC)    a. occurred in 2000, never placed on anti-coagulation. No known recurrence.    CAD (coronary artery disease)    a. cath 05/2016: 99% stenosis RI (DES placed), mild non-obstructive disease RCA, 70% along small caliber diagonal branch   HLD (hyperlipidemia)    a. intoelrant to statin therapy   Hypertension    Trigeminal neuralgia     Past Surgical History:  Procedure Laterality Date   ABDOMINAL HYSTERECTOMY     CARDIAC CATHETERIZATION N/A 05/14/2016   Procedure: Left Heart Cath and Coronary Angiography;  Surgeon: Kathleene Hazel, MD;  Location: [facility] INVASIVE CV LAB;  Service: Cardiovascular;  Laterality: N/A;   CARDIAC CATHETERIZATION N/A 05/14/2016   Procedure: Coronary Stent Intervention;  Surgeon: Kathleene Hazel, MD;  Location: [facility] INVASIVE CV LAB;  Service: Cardiovascular;  Laterality: N/A;   CHOLECYSTECTOMY     AGE 77   COLONOSCOPY N/A 12/25/2018   Procedure: COLONOSCOPY;  Surgeon: West Bali, MD;  Location: [facility];  Service: Endoscopy;  Laterality: N/A;  2:15pm   left index finger amputation  1978   cut by bandsaw at work.    POLYPECTOMY  12/25/2018   Procedure: POLYPECTOMY;  Surgeon: West Bali, MD;  Location: [facility];  Service: Endoscopy;;    Family History  Problem Relation Age of Onset   Heart Problems Mother    Stroke Father    Diabetes Father    Heart  attack Maternal Grandfather    Heart attack Brother    Heart attack Brother    Colon cancer Neg Hx    Colon polyps Neg Hx    Breast cancer Neg Hx     Social History   Socioeconomic History   Marital status: Divorced    Spouse name: Not on file   Number of children: Not on file   Years of education: Not on file   Highest education level: Not on file  Occupational History   Not on file  Tobacco Use   Smoking status: Never   Smokeless tobacco: Never  Vaping Use   Vaping status: Never Used  Substance and Sexual Activity   Alcohol use: No   Drug use: No   Sexual activity: Not Currently  Other Topics Concern   Not on file  Social History Narrative   Not on file   Social Determinants of Health   Financial Resource Strain: Not on file  Food Insecurity: Not on file  Transportation Needs: Not on file  Physical Activity: Not on file  Stress: Not on file  Social Connections: Not on file  Intimate Partner Violence: Not on file   Review of Systems  Constitutional:  Negative for chills and fever.  HENT:  Negative for sore throat.   Respiratory:  Negative for cough and shortness of breath.   Cardiovascular:  Negative for chest pain, palpitations and leg swelling.  Gastrointestinal:  Negative for abdominal pain, blood in stool, constipation, diarrhea, nausea and vomiting.  Genitourinary:  Negative for dysuria and hematuria.  Musculoskeletal:  Positive for joint pain (Left first MCP pain). Negative for myalgias.  Skin:  Negative for itching and rash.  Neurological:  Negative for dizziness and headaches.  Psychiatric/Behavioral:  Negative for depression and suicidal ideas.    Objective    BP (!) 148/86   Pulse 62   Resp 16   Ht 5\' 8"  (1.727 m)   Wt 170 lb (77.1 kg)   SpO2 95%   BMI 25.85 kg/m   Physical Exam Constitutional:      General: She is not in acute distress.    Appearance: Normal appearance. She is not toxic-appearing.  HENT:     Head: Normocephalic and  atraumatic.     Right Ear: External ear normal.     Left Ear: External ear normal.     Nose: Nose normal. No congestion or rhinorrhea.     Mouth/Throat:     Mouth: Mucous membranes are moist.     Pharynx: Oropharynx is clear. No oropharyngeal exudate or posterior oropharyngeal erythema.  Eyes:     General: No scleral icterus.    Extraocular Movements: Extraocular movements intact.     Conjunctiva/sclera: Conjunctivae normal.     Pupils: Pupils are equal, round, and reactive to light.  Cardiovascular:     Rate and Rhythm: Normal rate and regular rhythm.     Pulses: Normal pulses.     Heart sounds: Normal heart sounds. No murmur heard.    No friction rub. No gallop.  Pulmonary:     Effort: Pulmonary effort is normal.     Breath sounds: Normal breath sounds. No wheezing, rhonchi or rales.  Abdominal:     General: Abdomen is flat. Bowel sounds are normal. There is no distension.     Palpations: Abdomen is soft.     Tenderness: There is no abdominal tenderness.  Musculoskeletal:        General: Deformity (Second digit amputation of left hand) present. No swelling. Normal range of motion.     Cervical back: Normal range of motion.     Right lower leg: No edema.     Left lower leg: No edema.  Lymphadenopathy:     Cervical: No cervical adenopathy.  Skin:    General: Skin is warm and dry.     Capillary Refill: Capillary refill takes less than 2 seconds.     Coloration: Skin is not jaundiced.  Neurological:     General: No focal deficit present.     Mental Status: She is alert and oriented to person, place, and time.  Psychiatric:        Mood and Affect: Mood normal.        Behavior: Behavior normal.    Assessment & Plan:   Problem List Items Addressed This Visit       Essential hypertension    BP is mildly elevated in office today, 153/72 initially and 148/86 on repeat.  Her current antihypertensive regimen consists of amlodipine 5 mg daily, metoprolol tartrate 25 mg twice  daily, and HCTZ 12.5 mg daily.  She checks her blood pressure regularly at home and readings are well within goal. -No medication changes have been made today.  We will continue to monitor her BP at follow-up appointments.      CAD S/P PCI    History of CAD s/p PCI to RI with DES in August 2017.  Denies recent chest pain.  Followed by cardiology (Dr. Wyline Mood).  She is prescribed ASA 81 mg daily, metoprolol tartrate 25 mg twice daily, and Zetia.  Previously intolerant of statins and Repatha.      GERD (gastroesophageal reflux disease)    Symptoms are adequately controlled with omeprazole 20 mg daily.      Osteopenia    T-score -1.2 on DEXA from 2015. -Repeat vitamin D level ordered today      Dyslipidemia, goal LDL below 70    History of intolerance to both statins and Repatha.  She is currently prescribed Zetia and cholestyramine.      Breast cancer screening by mammogram - Primary    Screening mammogram ordered today      Need for influenza vaccination    Influenza vaccine administered today      Return in about 6 months (around 02/01/2024).   Billie Lade, MD

## 2023-08-04 NOTE — Assessment & Plan Note (Addendum)
BP is mildly elevated in office today, 153/72 initially and 148/86 on repeat.  Her current antihypertensive regimen consists of amlodipine 5 mg daily, metoprolol tartrate 25 mg twice daily, and HCTZ 12.5 mg daily.  She checks her blood pressure regularly at home and readings are well within goal. -No medication changes have been made today.  We will continue to monitor her BP at follow-up appointments.

## 2023-08-04 NOTE — Assessment & Plan Note (Addendum)
 Screening mammogram ordered today ?

## 2023-08-04 NOTE — Assessment & Plan Note (Signed)
History of CAD s/p PCI to RI with DES in August 2017.  Denies recent chest pain.  Followed by cardiology (Dr. Wyline Mood).  She is prescribed ASA 81 mg daily, metoprolol tartrate 25 mg twice daily, and Zetia.  Previously intolerant of statins and Repatha.

## 2023-08-04 NOTE — Assessment & Plan Note (Signed)
Symptoms are adequately controlled with omeprazole 20 mg daily. 

## 2023-08-05 LAB — CMP14+EGFR
ALT: 17 [IU]/L (ref 0–32)
AST: 21 [IU]/L (ref 0–40)
Albumin: 4.1 g/dL (ref 3.8–4.8)
Alkaline Phosphatase: 89 [IU]/L (ref 44–121)
BUN/Creatinine Ratio: 10 — ABNORMAL LOW (ref 12–28)
BUN: 9 mg/dL (ref 8–27)
Bilirubin Total: 0.3 mg/dL (ref 0.0–1.2)
CO2: 22 mmol/L (ref 20–29)
Calcium: 9.6 mg/dL (ref 8.7–10.3)
Chloride: 109 mmol/L — ABNORMAL HIGH (ref 96–106)
Creatinine, Ser: 0.86 mg/dL (ref 0.57–1.00)
Globulin, Total: 2.8 g/dL (ref 1.5–4.5)
Glucose: 76 mg/dL (ref 70–99)
Potassium: 4.1 mmol/L (ref 3.5–5.2)
Sodium: 143 mmol/L (ref 134–144)
Total Protein: 6.9 g/dL (ref 6.0–8.5)
eGFR: 71 mL/min/{1.73_m2} (ref >59)

## 2023-08-05 LAB — LIPID PANEL
Chol/HDL Ratio: 6 ratio — ABNORMAL HIGH (ref 0.0–4.4)
Cholesterol, Total: 241 mg/dL — ABNORMAL HIGH (ref 100–199)
HDL: 40 mg/dL (ref >39)
LDL Chol Calc (NIH): 166 mg/dL — ABNORMAL HIGH (ref 0–99)
Triglycerides: 191 mg/dL — ABNORMAL HIGH (ref 0–149)
VLDL Cholesterol Cal: 35 mg/dL (ref 5–40)

## 2023-08-05 LAB — CBC WITH DIFFERENTIAL/PLATELET
Basophils Absolute: 0 10*3/uL (ref 0.0–0.2)
Basos: 1 %
EOS (ABSOLUTE): 0.1 10*3/uL (ref 0.0–0.4)
Eos: 2 %
Hematocrit: 35.6 % (ref 34.0–46.6)
Hemoglobin: 11.5 g/dL (ref 11.1–15.9)
Immature Grans (Abs): 0 10*3/uL (ref 0.0–0.1)
Immature Granulocytes: 0 %
Lymphocytes Absolute: 1.8 10*3/uL (ref 0.7–3.1)
Lymphs: 36 %
MCH: 30.3 pg (ref 26.6–33.0)
MCHC: 32.3 g/dL (ref 31.5–35.7)
MCV: 94 fL (ref 79–97)
Monocytes Absolute: 0.5 10*3/uL (ref 0.1–0.9)
Monocytes: 11 %
Neutrophils Absolute: 2.6 10*3/uL (ref 1.4–7.0)
Neutrophils: 50 %
Platelets: 374 10*3/uL (ref 150–450)
RBC: 3.79 x10E6/uL (ref 3.77–5.28)
RDW: 11.7 % (ref 11.7–15.4)
WBC: 5 10*3/uL (ref 3.4–10.8)

## 2023-08-05 LAB — B12 AND FOLATE PANEL
Folate: 6.3 ng/mL (ref >3.0)
Vitamin B-12: 330 pg/mL (ref 232–1245)

## 2023-08-05 LAB — HEMOGLOBIN A1C
Est. average glucose Bld gHb Est-mCnc: 117 mg/dL
Hgb A1c MFr Bld: 5.7 % — ABNORMAL HIGH (ref 4.8–5.6)

## 2023-08-05 LAB — TSH+FREE T4
Free T4: 0.91 ng/dL (ref 0.82–1.77)
TSH: 0.412 u[IU]/mL — ABNORMAL LOW (ref 0.450–4.500)

## 2023-08-05 LAB — VITAMIN D 25 HYDROXY (VIT D DEFICIENCY, FRACTURES): Vit D, 25-Hydroxy: 21 ng/mL — ABNORMAL LOW (ref 30.0–100.0)

## 2023-08-16 IMAGING — MG MM DIGITAL SCREENING BILAT W/ TOMO AND CAD
8 series · 8 of 24 positions shown · non-contrast
Comparison: Previous exam(s).

CLINICAL DATA: Screening.

EXAM:
DIGITAL SCREENING BILATERAL MAMMOGRAM WITH TOMOSYNTHESIS AND CAD
TECHNIQUE: Bilateral screening digital craniocaudal and mediolateral oblique
mammograms were obtained. Bilateral screening digital breast
tomosynthesis was performed. The images were evaluated with
computer-aided detection.

[L CC synth-2D]
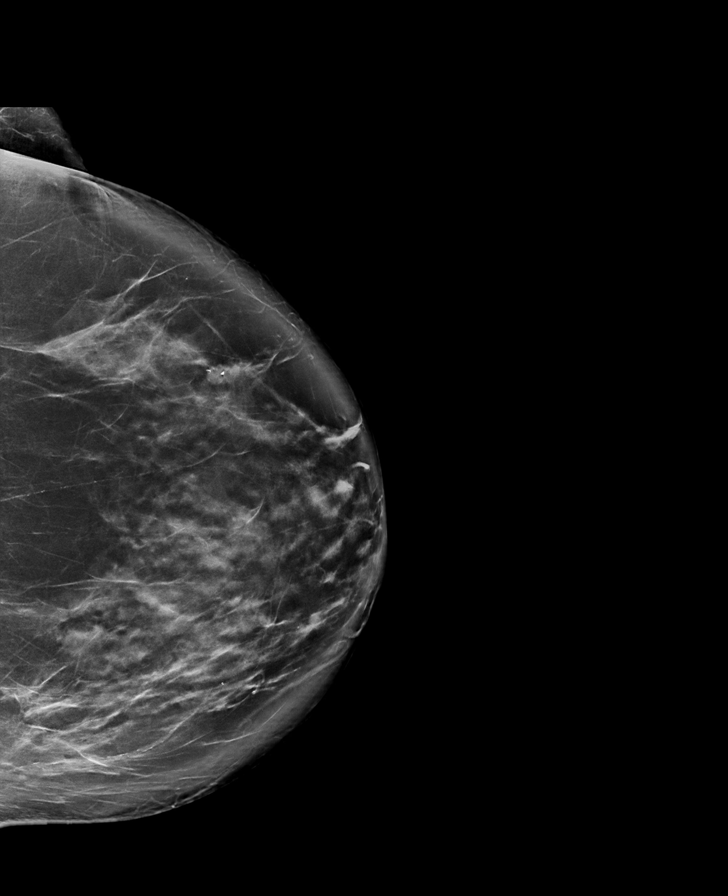

[R MLO synth-2D]
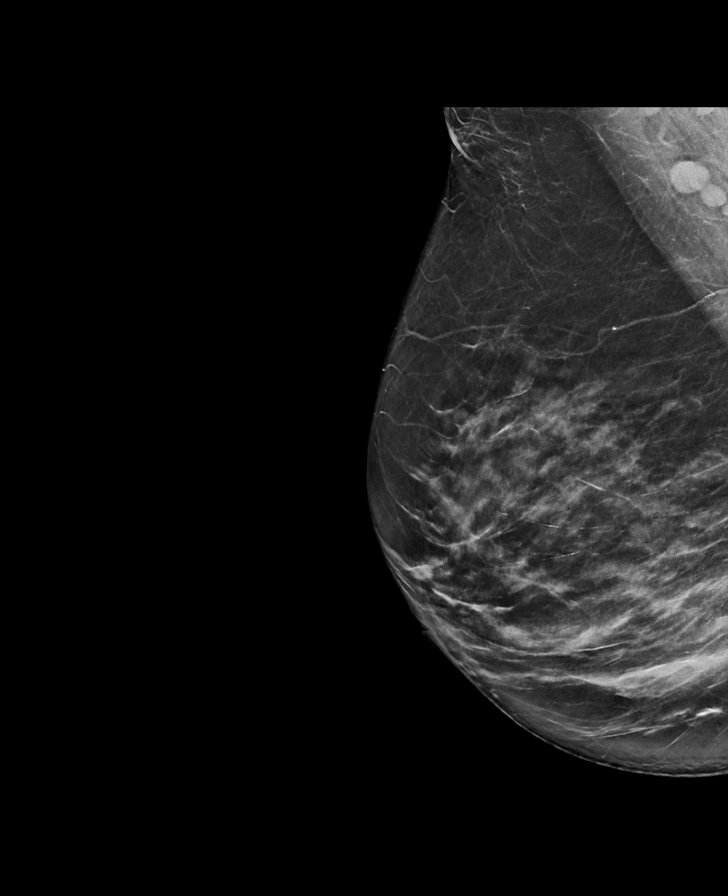

[L MLO synth-2D]
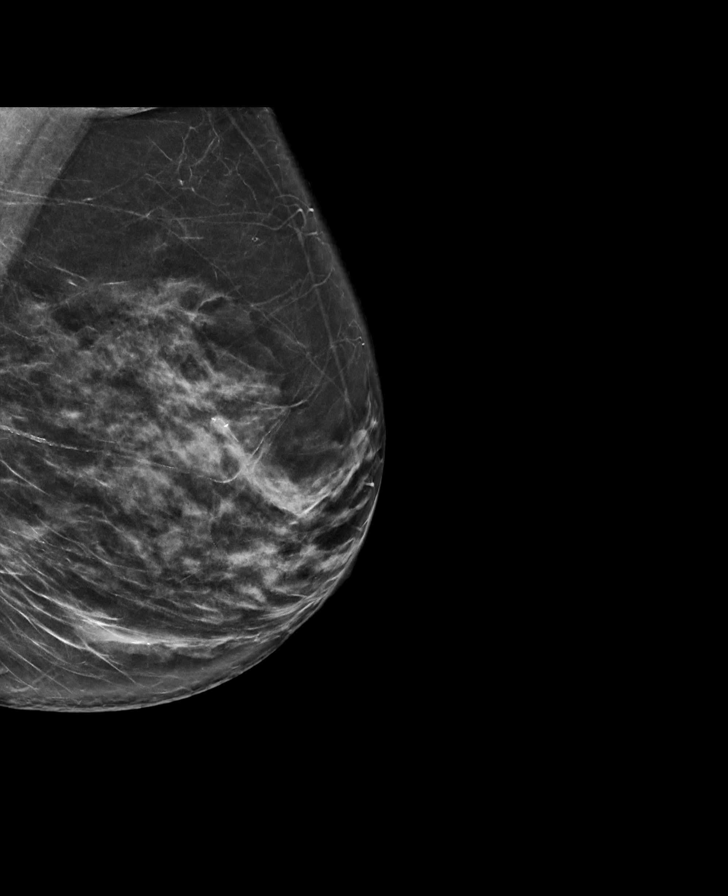

[R CC synth-2D]
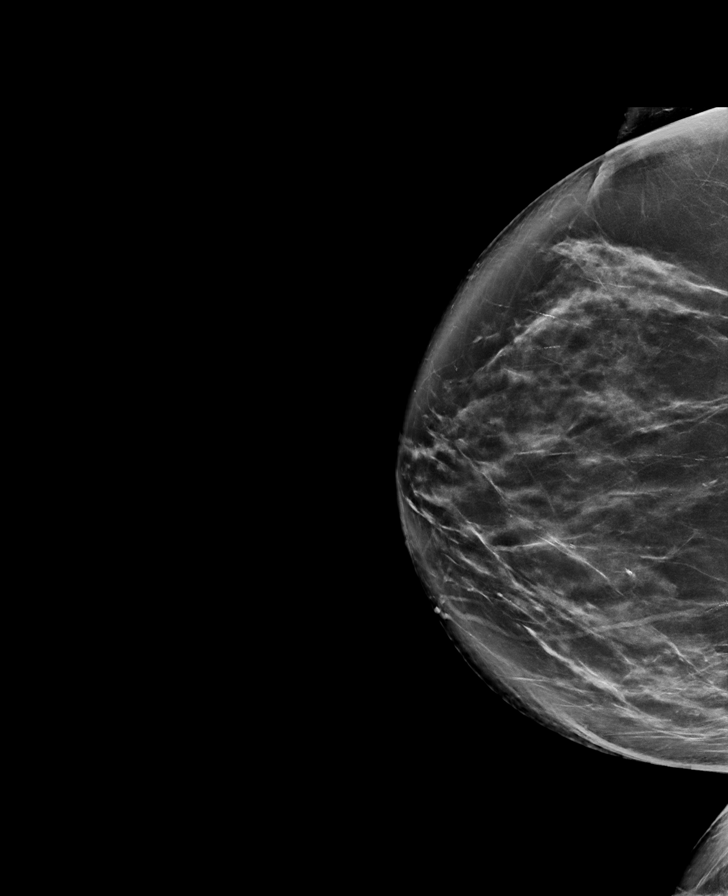

[R CC tomo · tomo slice 49/97.0]
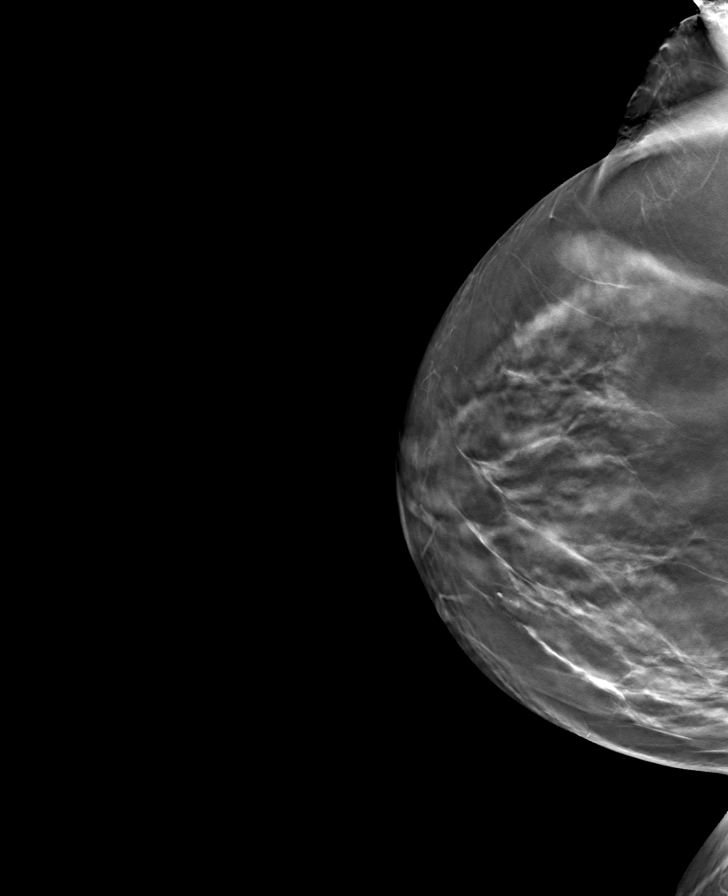

[L CC tomo · tomo slice 49/98.0]
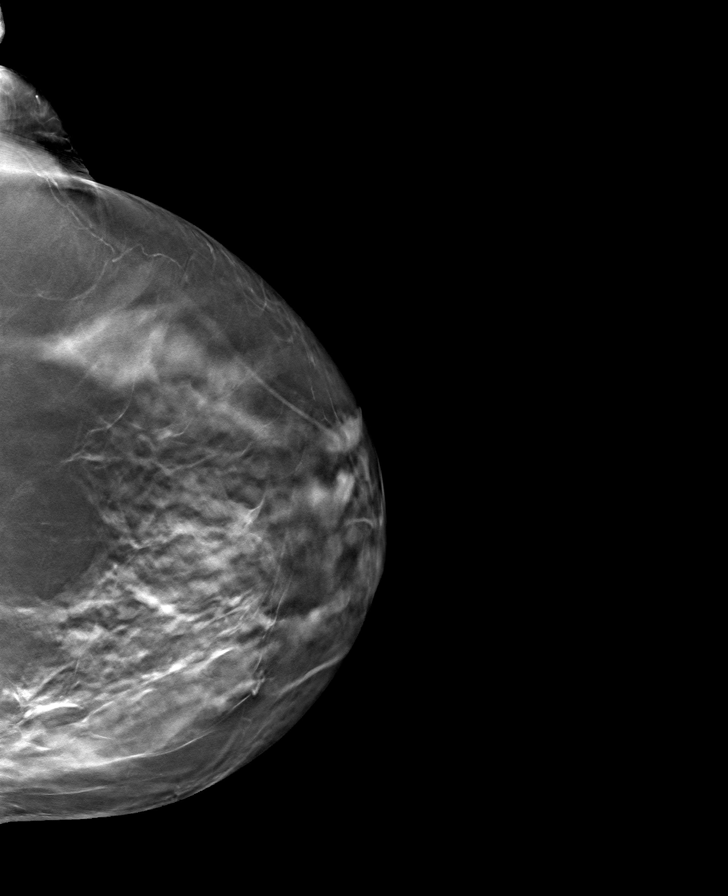

[L MLO tomo · tomo slice 42/83.0]
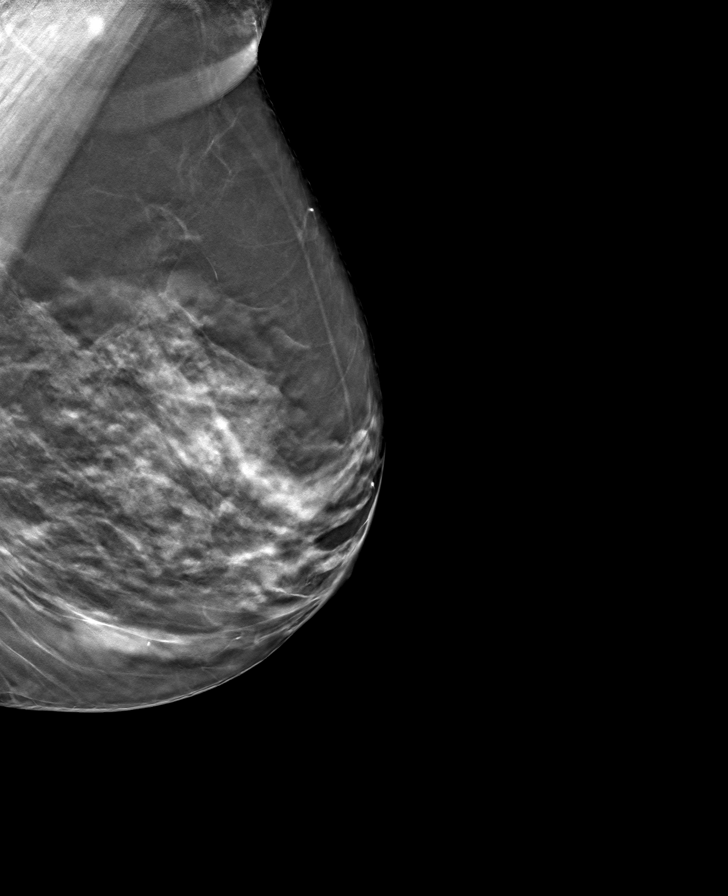

[R MLO tomo · tomo slice 41/82.0]
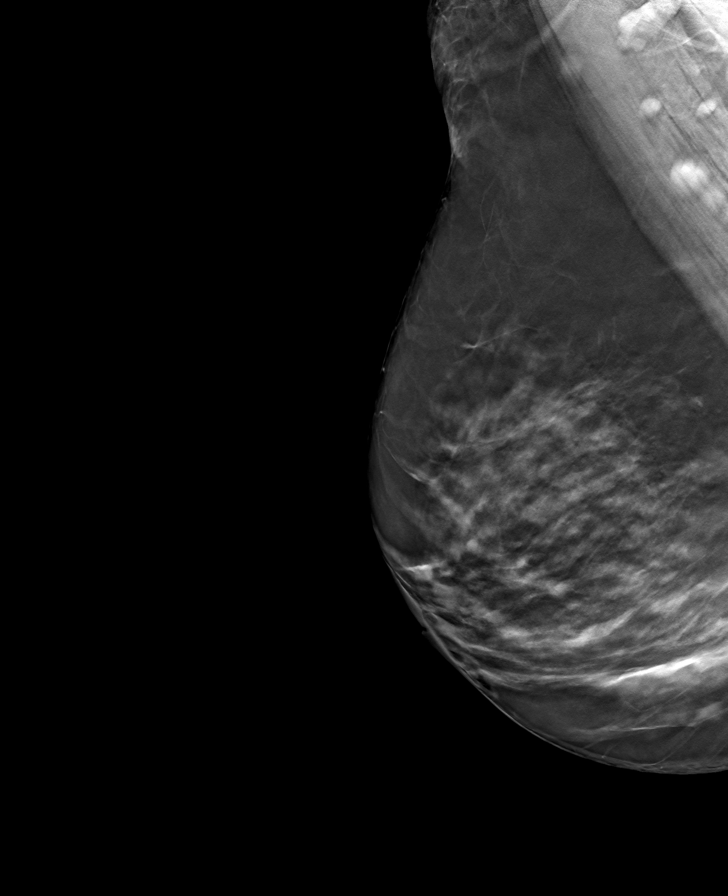

[8 of 24 positions shown; findings below may reference images not displayed]

ACR Breast Density Category c: The breast tissue is heterogeneously
dense, which may obscure small masses.
FINDINGS: There are no findings suspicious for malignancy.
IMPRESSION: No mammographic evidence of malignancy. A result letter of this
screening mammogram will be mailed directly to the patient.

RECOMMENDATION:
Screening mammogram in one year. (Code:Q3-W-BC3)

BI-RADS CATEGORY  1: Negative.

## 2023-10-10 ENCOUNTER — Other Ambulatory Visit: Payer: Self-pay | Admitting: Cardiology

## 2023-12-08 ENCOUNTER — Ambulatory Visit: Payer: HMO | Attending: Cardiology | Admitting: Cardiology

## 2023-12-08 ENCOUNTER — Encounter: Payer: Self-pay | Admitting: Cardiology

## 2023-12-08 ENCOUNTER — Telehealth: Payer: Self-pay | Admitting: Cardiology

## 2023-12-08 ENCOUNTER — Encounter: Payer: Self-pay | Admitting: *Deleted

## 2023-12-08 VITALS — BP 136/82 | HR 68 | Ht 69.0 in | Wt 175.8 lb

## 2023-12-08 DIAGNOSIS — I10 Essential (primary) hypertension: Secondary | ICD-10-CM

## 2023-12-08 DIAGNOSIS — E782 Mixed hyperlipidemia: Secondary | ICD-10-CM | POA: Diagnosis not present

## 2023-12-08 DIAGNOSIS — R079 Chest pain, unspecified: Secondary | ICD-10-CM | POA: Diagnosis not present

## 2023-12-08 DIAGNOSIS — I2510 Atherosclerotic heart disease of native coronary artery without angina pectoris: Secondary | ICD-10-CM | POA: Diagnosis not present

## 2023-12-08 NOTE — Telephone Encounter (Signed)
 Checking percert on the following patient for testing scheduled at [facility]  Exercise MV - chest pain 12/14/2023

## 2023-12-08 NOTE — Patient Instructions (Signed)
 Medication Instructions:   Continue all current medications.   Labwork:  none  Testing/Procedures:  Your physician has requested that you have an exercise stress myoview. For further information please visit [email or web address] Please follow instruction sheet, as given.  Office will contact with results via phone, letter or mychart.     Follow-Up:  6 months   Any Other Special Instructions Will Be Listed Below (If Applicable).   If you need a refill on your cardiac medications before your next appointment, please call your pharmacy.

## 2023-12-08 NOTE — Progress Notes (Signed)
 Clinical Summary Ms. Holton is a 78 y.o.female seen today for follow up of the following medical problems.    1. CAD - admit NSTEMI 05/2016, received DES to ramus. Echo at that time LVEF 55-60%, basalinferior hypokinesis.  - beta blocker limited due to bradycardia - lisinopirl stopped due to tongue swelling during ER visit 11/2017.        - recent chest pains. Right sided, dull pain. 2/10 in severity. At rest or with activity. Not positional. No other associated sypmtoms. Would last a minutes. Subsiding, decreasing frequency.  - EKG today NSR, no ischemic changes  - some recent chest pains. Midchest into left chest and shoulder. Dull like pain, 3/10 in severity. Tends to occur at rest or with exertion. No other associated symptoms. Not positional. Lasts for a few seconds, variable in frequency. No significant change in frequency or severity. No relation to food or eating - works moving boxesin the kitchen, no specific exertional symptoms.      2. Hyperlipidemia - statin intolerant. Started on zetia during 05/2016 admission.  - part of CLE[employer] research study - reluctant for pcski9i, but more open to it now.      - referred to lipid clinic, was to start repatha   -on repatha cold like symptoms, followed by severe muscle aches - symptoms starting to resolve. She reports long lingering symptoms  - not intersted in trying leqvio.        3. HTN - she reports pcp lowered meds prevoiusly due to low bp's.   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  Past Medical History:  Diagnosis Date   Atrial fibrillation (HCC)    a. occurred in 2000, never placed on anti-coagulation. No known recurrence.    CAD (coronary artery disease)    a. cath 05/2016: 99% stenosis RI (DES placed), mild non-obstructive disease RCA, 70% along small caliber diagonal Eldora Napp   HLD (hyperlipidemia)    a. intoelrant to statin  therapy   Hypertension    Trigeminal neuralgia      Allergies  Allergen Reactions   Bee Venom Anaphylaxis and Swelling   Halls Cough Drops [Menthol] Shortness Of Breath   Lisinopril Swelling   Atorvastatin    Codeine Nausea And Vomiting   Erythromycin    Milk-Related Compounds Other (See Comments)    Rash in inside of mouth , digestive issues   Morphine And Codeine Nausea And Vomiting   Niacin And Related Swelling   Other     PT CAN NOT BE AROUND HALLS COUGH DROPS -  IT TAKES HER BREATH AWAY    Statins      Current Outpatient Medications  Medication Sig Dispense Refill   amLODipine (NORVASC) 5 MG tablet Take 1 tablet by mouth once daily 90 tablet 0   aspirin EC 81 MG EC tablet Take 1 tablet (81 mg total) by mouth daily.     carbamazepine (CARBATROL) 200 MG 12 hr capsule Take 200 mg by mouth 2 (two) times daily as needed (FACIAL PAIN).  (Patient not taking: Reported on 08/04/2023)     ezetimibe (ZETIA) 10 MG tablet Take 1 tablet by mouth once daily 90 tablet 0   hydrochlorothiazide (HYDRODIURIL) 25 MG tablet Take 0.5 tablets (12.5 mg total) by mouth daily. 45 tablet 1   ibuprofen (ADVIL) 200 MG tablet Take 200 mg by mouth every 6 (  six) hours as needed. 600 mg     loratadine (CLARITIN) 10 MG tablet Take 10 mg by mouth daily as needed for allergies.     metoprolol tartrate (LOPRESSOR) 25 MG tablet TAKE 1 TABLET BY MOUTH EVERY MORNING AND 1/2 TABLET IN THE EVENING 135 tablet 1   nitroGLYCERIN (NITROSTAT) 0.4 MG SL tablet Place 1 tablet (0.4 mg total) under the tongue every 5 (five) minutes x 3 doses as needed for chest pain (up to 3 doses, If no relief after 3rd dose, proceed to ED or call 911). 25 tablet 3   omeprazole (PRILOSEC) 20 MG capsule TAKE 1 CAPSULE BY MOUTH EVERY DAY 30 MINUTES BEFORE BREAKFAST 90 capsule 3   potassium chloride SA (KLOR-CON M) 20 MEQ tablet Take 1 tablet (20 mEq total) by mouth daily. 90 tablet 1   No current facility-administered medications for this  visit.     Past Surgical History:  Procedure Laterality Date   ABDOMINAL HYSTERECTOMY     CARDIAC CATHETERIZATION N/A 05/14/2016   Procedure: Left Heart Cath and Coronary Angiography;  Surgeon: Kathleene Hazel, MD;  Location: [facility]VAS[facility] CV LAB;  Service: Cardiovascular;  Laterality: N/A;   CARDIAC CATHETERIZATION N/A 05/14/2016   Procedure: Coronary Stent Intervention;  Surgeon: Kathleene Hazel, MD;  Location: [facility] INVASIVE CV LAB;  Service: Cardiovascular;  Laterality: N/A;   CHOLECYSTECTOMY     AGE 60   COLONOSCOPY N/A 12/25/2018   Procedure: COLONOSCOPY;  Surgeon: West Bali, MD;  Location: [facility] SU[facility];  Service: Endoscopy;  Laterality: N/A;  2:15pm   left index finger amputation  1978   cut by bandsaw at work.    POLYPECTOMY  12/25/2018   Procedure: POLYPECTOMY;  Surgeon: West Bali, MD;  Location: [facility] SUITE;  Service: Endoscopy;;     Allergies  Allergen Reactions   Bee Venom Anaphylaxis and Swelling   Halls Cough Drops [Menthol] Shortness Of Breath   Lisinopril Swelling   Atorvastatin    Codeine Nausea And Vomiting   Erythromycin    Milk-Related Compounds Other (See Comments)    Rash in inside of mouth , digestive issues   Morphine And Codeine Nausea And Vomiting   Niacin And Related Swelling   Other     PT CAN NOT BE AROUND HALLS COUGH DROPS -  IT TAKES HER BREATH AWAY    Statins       Family History  Problem Relation Age of Onset   Heart Problems Mother    Stroke Father    Diabetes Father    Heart attack Maternal Grandfather    Heart attack Brother    Heart attack Brother    Colon cancer Neg Hx    Colon polyps Neg Hx    Breast cancer Neg Hx      Social History Ms. Mcneish reports that she has never smoked. She has never used smokeless tobacco. Ms. Zavaleta reports no history of alcohol use.     Physical Examination Today's Vitals   12/08/23 1350  BP: 136/82  Pulse: 68  SpO2: 97%  Weight: 175 lb 12.8 oz (79.7 kg)  Height: 5'  9" (1.753 m)   Body mass index is 25.96 kg/m.  Gen: resting comfortably, no acute distress HEENT: no scleral icterus, pupils equal round and reactive, no palptable cervical adenopathy,  CV: RRR, no mrg, no jvd Resp: Clear to auscultation bilaterally GI: abdomen is soft, non-tender, non-distended, normal bowel sounds, no hepatosplenomegaly MSK: extremities are warm, no edema.  Skin: warm, no rash Neuro:  no focal deficits Psych: appropriate affect   Diagnostic Studies   05/2016 cath   Prox RCA to Mid RCA lesion, 40 %stenosed. Ost Ramus lesion, 30 %stenosed. A STENT PROMUS PREM MR 2.25X16 drug eluting stent was successfully placed. Ramus lesion, 99 %stenosed. Post intervention, there is a 0% residual stenosis. 1st Diag lesion, 70 %stenosed. The left ventricular systolic function is normal. LV end diastolic pressure is normal. The left ventricular ejection fraction is 55-65% by visual estimate. There is no mitral valve regurgitation.   1. Severe stenosis mid Ramus intermediate Glendon Fiser, culprit for NSTEMI 2. Mild non-obstructive disease in the RCA.  3. Moderate stenosis small caliber Diagonal Shakira Los 4. Normal LV systolic function 5. Successful PTCA/DES x 1 mid Ramus intermediate Mikaiya Tramble   Recommendations: Continue DAPT with ASA and Brilinta for one year. She is statin intolerant. Consider PCSK9-inh after discharge. Start beta blocker. Follow up in [facility] office.       Assessment and Plan  1. CAD -some recent chest pains, unlcear etiology, mixed in description as far as possible cardiac etiology - EKG today shows SR, no ischemic changes - plan for exercise nuclear stress test, hold metoprolol day of test.    2. Hyperlipidemia - intolerant to statins, has been on zetia with very high LDL. - intolerant to repatha - she remains not interested in leqvio   3. HTN - slightly elevated here but home numbers well controlled, continue current meds  F/u 6  months      Antoine Poche, M.D.

## 2023-12-14 ENCOUNTER — Ambulatory Visit (HOSPITAL_COMMUNITY)
Admission: RE | Admit: 2023-12-14 | Discharge: 2023-12-14 | Disposition: A | Discharge status: Home / Self Care | Source: Ambulatory Visit | Attending: Cardiology | Admitting: Cardiology

## 2023-12-14 DIAGNOSIS — R079 Chest pain, unspecified: Secondary | ICD-10-CM | POA: Insufficient documentation

## 2023-12-14 LAB — NM MYOCAR MULTI W/SPECT W/WALL MOTION / EF
Estimated workload: 7
Exercise duration (min): 5 min
Exercise duration (sec): 30 s
LV dias vol: 59 mL (ref 46–106)
LV sys vol: 13 mL
MPHR: 145 {beats}/min
Nuc Stress EF: 78 %
Peak HR: 116 {beats}/min
Percent HR: 80 %
RATE: 0.3
Rest HR: 60 {beats}/min
Rest Nuclear Isotope Dose: 10.2 mCi
SDS: 0
SRS: 1
SSS: 1
Stress Nuclear Isotope Dose: 32.5 mCi
TID: 1.06

## 2023-12-14 MED ORDER — REGADENOSON 0.4 MG/5ML IV SOLN
INTRAVENOUS | Status: AC
  Administered 2023-12-14: 0.4 mg via INTRAVENOUS
  Filled 2023-12-14: qty 5

## 2023-12-14 MED ORDER — TECHNETIUM TC 99M TETROFOSMIN IV KIT
10.0000 | PACK | Freq: Once | INTRAVENOUS | Status: AC | PRN
  Administered 2023-12-14: 10.2 via INTRAVENOUS

## 2023-12-14 MED ORDER — TECHNETIUM TC 99M TETROFOSMIN IV KIT
30.0000 | PACK | Freq: Once | INTRAVENOUS | Status: AC | PRN
  Administered 2023-12-14: 32.5 via INTRAVENOUS

## 2023-12-14 MED ORDER — SODIUM CHLORIDE FLUSH 0.9 % IV SOLN
INTRAVENOUS | Status: AC
  Administered 2023-12-14: 10 mL via INTRAVENOUS
  Filled 2023-12-14: qty 10

## 2023-12-22 ENCOUNTER — Encounter: Payer: Self-pay | Admitting: *Deleted

## 2023-12-30 ENCOUNTER — Ambulatory Visit (HOSPITAL_COMMUNITY)
Admission: RE | Admit: 2023-12-30 | Discharge: 2023-12-30 | Disposition: A | Discharge status: Home / Self Care | Source: Ambulatory Visit | Attending: Internal Medicine | Admitting: Internal Medicine

## 2023-12-30 DIAGNOSIS — Z1231 Encounter for screening mammogram for malignant neoplasm of breast: Secondary | ICD-10-CM | POA: Diagnosis present

## 2024-01-31 ENCOUNTER — Encounter: Payer: Self-pay | Admitting: Internal Medicine

## 2024-01-31 ENCOUNTER — Other Ambulatory Visit: Payer: Self-pay | Admitting: Cardiology

## 2024-01-31 ENCOUNTER — Ambulatory Visit (INDEPENDENT_AMBULATORY_CARE_PROVIDER_SITE_OTHER): Payer: HMO | Admitting: Internal Medicine

## 2024-01-31 VITALS — BP 150/78 | HR 66 | Ht 68.0 in | Wt 177.0 lb

## 2024-01-31 DIAGNOSIS — Z9861 Coronary angioplasty status: Secondary | ICD-10-CM

## 2024-01-31 DIAGNOSIS — I10 Essential (primary) hypertension: Secondary | ICD-10-CM

## 2024-01-31 DIAGNOSIS — E785 Hyperlipidemia, unspecified: Secondary | ICD-10-CM

## 2024-01-31 DIAGNOSIS — R7989 Other specified abnormal findings of blood chemistry: Secondary | ICD-10-CM | POA: Diagnosis not present

## 2024-01-31 DIAGNOSIS — I2510 Atherosclerotic heart disease of native coronary artery without angina pectoris: Secondary | ICD-10-CM

## 2024-01-31 DIAGNOSIS — E559 Vitamin D deficiency, unspecified: Secondary | ICD-10-CM | POA: Insufficient documentation

## 2024-01-31 DIAGNOSIS — R7303 Prediabetes: Secondary | ICD-10-CM | POA: Insufficient documentation

## 2024-01-31 DIAGNOSIS — K219 Gastro-esophageal reflux disease without esophagitis: Secondary | ICD-10-CM

## 2024-01-31 NOTE — Assessment & Plan Note (Signed)
 Lipid panel updated in October.  Total cholesterol 241 and LDL 166.  She has a previously documented history of intolerance to the statins and Repatha .  Leqvio has most recently been discussed with cardiology but the patient is not interested due to concern for potential adverse side effects.

## 2024-01-31 NOTE — Patient Instructions (Signed)
 It was a pleasure to see you today.  Thank you for giving us  the opportunity to be involved in your care.  Below is a brief recap of your visit and next steps.  We will plan to see you again in 6 months.  Summary No medication changes today Repeat labs Follow up in 6 months

## 2024-01-31 NOTE — Progress Notes (Signed)
 Established Patient Office Visit  Subjective   Patient ID: Tricia Savage, female    DOB: 1946-01-09  Age: 78 y.o. MRN: [id number]  Chief Complaint  Patient presents with   Care Management    Follow up   Tricia Savage returns to care today for routine follow-up.  She was last evaluated by me in October 2024 as a new patient presenting to establish care.  No medication changes were made at time, repeat labs ordered, and 57-month follow-up arranged.  In the interim, she was seen by cardiology on 3/6 endorsing recent chest pain.  She completed a nuclear medicine stress test, which was normal.  There have otherwise been no acute interval events.  Tricia Savage reports feeling well today.  She endorses chronic arthralgias but is otherwise asymptomatic and has no acute concerns to discuss.  Past Medical History:  Diagnosis Date   Atrial fibrillation (HCC)    a. occurred in 2000, never placed on anti-coagulation. No known recurrence.    CAD (coronary artery disease)    a. cath 05/2016: 99% stenosis RI (DES placed), mild non-obstructive disease RCA, 70% along small caliber diagonal branch   HLD (hyperlipidemia)    a. intoelrant to statin therapy   Hypertension    Trigeminal neuralgia    Past Surgical History:  Procedure Laterality Date   ABDOMINAL HYSTERECTOMY     CARDIAC CATHETERIZATION N/A 05/14/2016   Procedure: Left Heart Cath and Coronary Angiography;  Surgeon: Odie Benne, MD;  Location: [facility] INVASIVE CV LAB;  Service: Cardiovascular;  Laterality: N/A;   CARDIAC CATHETERIZATION N/A 05/14/2016   Procedure: Coronary Stent Intervention;  Surgeon: Odie Benne, MD;  Location: [facility] INVASIVE CV LAB;  Service: Cardiovascular;  Laterality: N/A;   CHOLECYSTECTOMY     AGE 22   COLONOSCOPY N/A 12/25/2018   Procedure: COLONOSCOPY;  Surgeon: Alyce Jubilee, MD;  Location: [facility];  Service: Endoscopy;  Laterality: N/A;  2:15pm   left index finger amputation  1978   cut by bandsaw at  work.    POLYPECTOMY  12/25/2018   Procedure: POLYPECTOMY;  Surgeon: Alyce Jubilee, MD;  Location: [facility];  Service: Endoscopy;;   Social History   Tobacco Use   Smoking status: Never   Smokeless tobacco: Never  Vaping Use   Vaping status: Never Used  Substance Use Topics   Alcohol use: No   Drug use: No   Family History  Problem Relation Age of Onset   Heart Problems Mother    Stroke Father    Diabetes Father    Heart attack Maternal Grandfather    Heart attack Brother    Heart attack Brother    Colon cancer Neg Hx    Colon polyps Neg Hx    Breast cancer Neg Hx    Allergies  Allergen Reactions   Bee Venom Anaphylaxis and Swelling   Halls Cough Drops [Menthol] Shortness Of Breath   Lisinopril  Swelling   Atorvastatin    Codeine Nausea And Vomiting   Erythromycin    Milk-Related Compounds Other (See Comments)    Rash in inside of mouth , digestive issues   Morphine And Codeine Nausea And Vomiting   Niacin And Related Swelling   Other     PT CAN NOT BE AROUND HALLS COUGH DROPS -  IT TAKES HER BREATH AWAY    Statins    Review of Systems  Constitutional:  Negative for chills and fever.  HENT:  Negative for sore throat.  Respiratory:  Negative for cough and shortness of breath.   Cardiovascular:  Negative for chest pain, palpitations and leg swelling.  Gastrointestinal:  Negative for abdominal pain, blood in stool, constipation, diarrhea, nausea and vomiting.  Genitourinary:  Negative for dysuria and hematuria.  Musculoskeletal:  Positive for joint pain (chronic arthralgias). Negative for myalgias.  Skin:  Negative for itching and rash.  Neurological:  Negative for dizziness and headaches.  Psychiatric/Behavioral:  Negative for depression and suicidal ideas.      Objective:     BP (!) 150/78   Pulse 66   Ht 5\' 8"  (1.727 m)   Wt 177 lb (80.3 kg)   SpO2 95%   BMI 26.91 kg/m  BP Readings from Last 3 Encounters:  01/31/24 (!) 150/78  12/08/23 136/82   08/04/23 (!) 148/86   Physical Exam Constitutional:      General: She is not in acute distress.    Appearance: Normal appearance. She is not toxic-appearing.  HENT:     Head: Normocephalic and atraumatic.     Right Ear: External ear normal.     Left Ear: External ear normal.     Nose: Nose normal. No congestion or rhinorrhea.     Mouth/Throat:     Mouth: Mucous membranes are moist.     Pharynx: Oropharynx is clear. No oropharyngeal exudate or posterior oropharyngeal erythema.  Eyes:     General: No scleral icterus.    Extraocular Movements: Extraocular movements intact.     Conjunctiva/sclera: Conjunctivae normal.     Pupils: Pupils are equal, round, and reactive to light.  Cardiovascular:     Rate and Rhythm: Normal rate and regular rhythm.     Pulses: Normal pulses.     Heart sounds: Normal heart sounds. No murmur heard.    No friction rub. No gallop.  Pulmonary:     Effort: Pulmonary effort is normal.     Breath sounds: Normal breath sounds. No wheezing, rhonchi or rales.  Abdominal:     General: Abdomen is flat. Bowel sounds are normal. There is no distension.     Palpations: Abdomen is soft.     Tenderness: There is no abdominal tenderness.  Musculoskeletal:        General: Deformity (Second digit amputation of left hand) present. No swelling. Normal range of motion.     Cervical back: Normal range of motion.     Right lower leg: No edema.     Left lower leg: No edema.  Lymphadenopathy:     Cervical: No cervical adenopathy.  Skin:    General: Skin is warm and dry.     Capillary Refill: Capillary refill takes less than 2 seconds.     Coloration: Skin is not jaundiced.  Neurological:     General: No focal deficit present.     Mental Status: She is alert and oriented to person, place, and time.  Psychiatric:        Mood and Affect: Mood normal.        Behavior: Behavior normal.   Last CBC Lab Results  Component Value Date   WBC 5.0 08/04/2023   HGB 11.5  08/04/2023   HCT 35.6 08/04/2023   MCV 94 08/04/2023   MCH 30.3 08/04/2023   RDW 11.7 08/04/2023   PLT 374 08/04/2023   Last metabolic panel Lab Results  Component Value Date   GLUCOSE 76 08/04/2023   NA 143 08/04/2023   K 4.1 08/04/2023   CL 109 (H) 08/04/2023  CO2 22 08/04/2023   BUN 9 08/04/2023   CREATININE 0.86 08/04/2023   EGFR 71 08/04/2023   CALCIUM 9.6 08/04/2023   PROT 6.9 08/04/2023   ALBUMIN 4.1 08/04/2023   LABGLOB 2.8 08/04/2023   BILITOT 0.3 08/04/2023   ALKPHOS 89 08/04/2023   AST 21 08/04/2023   ALT 17 08/04/2023   ANIONGAP 11 05/15/2016   Last lipids Lab Results  Component Value Date   CHOL 241 (H) 08/04/2023   HDL 40 08/04/2023   LDLCALC 166 (H) 08/04/2023   TRIG 191 (H) 08/04/2023   CHOLHDL 6.0 (H) 08/04/2023   Last hemoglobin A1c Lab Results  Component Value Date   HGBA1C 5.7 (H) 08/04/2023   Last thyroid  functions Lab Results  Component Value Date   TSH 0.412 (L) 08/04/2023   Last vitamin D  Lab Results  Component Value Date   VD25OH 21.0 (L) 08/04/2023   Last vitamin B12 and Folate Lab Results  Component Value Date   VITAMINB12 330 08/04/2023   FOLATE 6.3 08/04/2023     Assessment & Plan:   Problem List Items Addressed This Visit       Essential hypertension   Her current antihypertensive regimen consists of amlodipine  5 mg daily, metoprolol  tartrate 25 mg twice daily, and HCTZ 12.5 mg daily.  BP is mildly elevated today.  Her antihypertensive medications have previously been reduced due to hypotension.  She checks her blood pressure regularly at home and reports systolic readings consistently ranging 120-130 mmHg.  No medication changes were made today.  Will continue to closely monitor BP at subsequent appointments.      CAD S/P PCI   S/p PCI to RI with DES in August 2017.  Recently completed NM stress test that was normal in the setting of recurrent chest pain.  She remains on ASA 81 mg daily, metoprolol  tartrate 25 mg  twice daily, and Zetia .      GERD (gastroesophageal reflux disease)   Symptoms remain well-controlled with omeprazole .      Dyslipidemia, goal LDL below 70   Lipid panel updated in October.  Total cholesterol 241 and LDL 166.  She has a previously documented history of intolerance to the statins and Repatha .  Leqvio has most recently been discussed with cardiology but the patient is not interested due to concern for potential adverse side effects.      Vitamin D  insufficiency - Primary   Noted on labs from October.  She has started daily vitamin D  + calcium supplementation.  Will check vitamin D  level again today.      Prediabetes   A1c 5.7 on labs from October.  We discussed the need to make significant dietary changes in an effort to improve both her blood sugar and cholesterol levels.      Abnormal TSH   TSH mildly decreased to 0.4.  No prior history of thyroid  dysfunction.  Repeat TSH/T4 ordered today.       Return in about 6 months (around 08/01/2024) for CPE.    Tobi Fortes, MD

## 2024-01-31 NOTE — Assessment & Plan Note (Signed)
 Her current antihypertensive regimen consists of amlodipine  5 mg daily, metoprolol  tartrate 25 mg twice daily, and HCTZ 12.5 mg daily.  BP is mildly elevated today.  Her antihypertensive medications have previously been reduced due to hypotension.  She checks her blood pressure regularly at home and reports systolic readings consistently ranging 120-130 mmHg.  No medication changes were made today.  Will continue to closely monitor BP at subsequent appointments.

## 2024-01-31 NOTE — Assessment & Plan Note (Signed)
 TSH mildly decreased to 0.4.  No prior history of thyroid  dysfunction.  Repeat TSH/T4 ordered today.

## 2024-01-31 NOTE — Assessment & Plan Note (Signed)
 Noted on labs from October.  She has started daily vitamin D  + calcium supplementation.  Will check vitamin D  level again today.

## 2024-01-31 NOTE — Assessment & Plan Note (Signed)
 S/p PCI to RI with DES in August 2017.  Recently completed NM stress test that was normal in the setting of recurrent chest pain.  She remains on ASA 81 mg daily, metoprolol  tartrate 25 mg twice daily, and Zetia .

## 2024-01-31 NOTE — Assessment & Plan Note (Signed)
 A1c 5.7 on labs from October.  We discussed the need to make significant dietary changes in an effort to improve both her blood sugar and cholesterol levels.

## 2024-01-31 NOTE — Assessment & Plan Note (Signed)
Symptoms remain well-controlled with omeprazole. 

## 2024-02-01 ENCOUNTER — Other Ambulatory Visit: Payer: Self-pay

## 2024-02-01 ENCOUNTER — Encounter: Payer: Self-pay | Admitting: Internal Medicine

## 2024-02-01 LAB — TSH+FREE T4
Free T4: 0.86 ng/dL (ref 0.82–1.77)
TSH: 1.73 u[IU]/mL (ref 0.450–4.500)

## 2024-02-01 LAB — VITAMIN D 25 HYDROXY (VIT D DEFICIENCY, FRACTURES): Vit D, 25-Hydroxy: 29 ng/mL — ABNORMAL LOW (ref 30.0–100.0)

## 2024-02-01 MED ORDER — EZETIMIBE 10 MG PO TABS: 10.0000 mg | ORAL_TABLET | Freq: Every day | ORAL | 2 refills | Status: AC

## 2024-03-27 ENCOUNTER — Other Ambulatory Visit: Payer: Self-pay

## 2024-03-27 MED ORDER — POTASSIUM CHLORIDE CRYS ER 20 MEQ PO TBCR: 20.0000 meq | EXTENDED_RELEASE_TABLET | Freq: Every day | ORAL | 2 refills | Status: DC

## 2024-03-27 MED ORDER — POTASSIUM CHLORIDE CRYS ER 20 MEQ PO TBCR: 20.0000 meq | EXTENDED_RELEASE_TABLET | Freq: Every day | ORAL | 2 refills | Status: AC

## 2024-03-27 MED ORDER — METOPROLOL TARTRATE 25 MG PO TABS: ORAL_TABLET | ORAL | 2 refills | Status: AC

## 2024-04-16 ENCOUNTER — Other Ambulatory Visit: Payer: Self-pay

## 2024-04-16 DIAGNOSIS — K219 Gastro-esophageal reflux disease without esophagitis: Secondary | ICD-10-CM

## 2024-04-16 MED ORDER — OMEPRAZOLE 20 MG PO CPDR: DELAYED_RELEASE_CAPSULE | ORAL | 3 refills | Status: AC

## 2024-04-19 ENCOUNTER — Other Ambulatory Visit: Payer: Self-pay

## 2024-07-10 ENCOUNTER — Other Ambulatory Visit: Payer: Self-pay

## 2024-07-10 DIAGNOSIS — I10 Essential (primary) hypertension: Secondary | ICD-10-CM

## 2024-07-10 MED ORDER — HYDROCHLOROTHIAZIDE 12.5 MG PO TABS: 12.5000 mg | ORAL_TABLET | Freq: Every day | ORAL | 1 refills | Status: DC

## 2024-07-12 ENCOUNTER — Other Ambulatory Visit: Payer: Self-pay

## 2024-07-12 DIAGNOSIS — I10 Essential (primary) hypertension: Secondary | ICD-10-CM

## 2024-07-12 MED ORDER — HYDROCHLOROTHIAZIDE 12.5 MG PO TABS: 12.5000 mg | ORAL_TABLET | Freq: Every day | ORAL | 1 refills | Status: AC

## 2024-08-01 ENCOUNTER — Ambulatory Visit

## 2024-08-01 VITALS — BP 130/82 | HR 67 | Ht 68.0 in | Wt 168.0 lb

## 2024-08-01 DIAGNOSIS — I2510 Atherosclerotic heart disease of native coronary artery without angina pectoris: Secondary | ICD-10-CM | POA: Diagnosis not present

## 2024-08-01 DIAGNOSIS — E559 Vitamin D deficiency, unspecified: Secondary | ICD-10-CM | POA: Diagnosis not present

## 2024-08-01 DIAGNOSIS — Z23 Encounter for immunization: Secondary | ICD-10-CM | POA: Diagnosis not present

## 2024-08-01 DIAGNOSIS — I10 Essential (primary) hypertension: Secondary | ICD-10-CM | POA: Diagnosis not present

## 2024-08-01 DIAGNOSIS — Z9861 Coronary angioplasty status: Secondary | ICD-10-CM

## 2024-08-01 DIAGNOSIS — E785 Hyperlipidemia, unspecified: Secondary | ICD-10-CM

## 2024-08-01 DIAGNOSIS — R7303 Prediabetes: Secondary | ICD-10-CM

## 2024-08-01 NOTE — Progress Notes (Signed)
 Established Patient Office Visit  Subjective   Patient ID: Tricia Savage, female    DOB: 17-Jun-1946  Age: 78 y.o. MRN: [id number]  Chief Complaint  Patient presents with   Medical Management of Chronic Issues    6 month follow up     HPI Discussed the use of AI scribe software for clinical note transcription with the patient, who gave verbal consent to proceed.  History of Present Illness   Tricia Savage is a 78 year old female who presents for a six-month follow-up visit.  Diarrhea and bowel urgency - Loose bowel movements since earlier in the year - Associated with urgency and inability to delay defecation - Uncertain if symptoms are related to a tick bite earlier in the year - History of cholecystectomy  Arthralgia - Arthritis-related pain, particularly in the hands - Uses Voltaren gel for symptomatic relief - Remains physically active, including mowing grass and maintaining her house  Peripheral neuropathy symptoms - Burning sensations in the toes - Describes this as a new symptom - Suspects neuropathy  Elevated blood pressure in clinical settings - Elevated blood pressure readings in medical settings attributed to 'white coat syndrome'      Patient Active Problem List   Diagnosis Date Noted   Vitamin D  insufficiency 01/31/2024   Prediabetes 01/31/2024   Abnormal TSH 01/31/2024   GERD (gastroesophageal reflux disease) 08/04/2023   Breast cancer screening by mammogram 08/04/2023   Osteopenia 08/04/2023   Need for influenza vaccination 08/04/2023   Myalgia due to statin 06/22/2022   Need for hepatitis C screening test 07/12/2019   Heme positive stool 11/01/2018   CAD S/P PCI 05/31/2016   Dyslipidemia, goal LDL below 70 05/15/2016   Essential hypertension 05/15/2016   Non-STEMI (non-ST elevated myocardial infarction) (HCC) 05/14/2016      ROS    Objective:     BP 130/82 (BP Location: Left Arm, Patient Position: Sitting, Cuff Size: Normal)   Pulse 67    Ht 5' 8 (1.727 m)   Wt 168 lb 0.6 oz (76.2 kg)   SpO2 94%   BMI 25.55 kg/m  BP Readings from Last 3 Encounters:  08/01/24 130/82  01/31/24 (!) 150/78  12/08/23 136/82   Wt Readings from Last 3 Encounters:  08/01/24 168 lb 0.6 oz (76.2 kg)  01/31/24 177 lb (80.3 kg)  12/08/23 175 lb 12.8 oz (79.7 kg)     Physical Exam Vitals and nursing note reviewed.  Constitutional:      Appearance: Normal appearance.  HENT:     Head: Normocephalic.     Right Ear: Tympanic membrane, ear canal and external ear normal.     Left Ear: Tympanic membrane, ear canal and external ear normal.     Nose: Nose normal.     Mouth/Throat:     Mouth: Mucous membranes are moist.     Pharynx: Oropharynx is clear.  Eyes:     Extraocular Movements: Extraocular movements intact.     Pupils: Pupils are equal, round, and reactive to light.  Cardiovascular:     Rate and Rhythm: Normal rate and regular rhythm.  Pulmonary:     Effort: Pulmonary effort is normal.     Breath sounds: Normal breath sounds.  Musculoskeletal:     Cervical back: Normal range of motion and neck supple.  Skin:    General: Skin is warm and dry.  Neurological:     Mental Status: She is alert and oriented to person, place, and time.  Psychiatric:        Mood and Affect: Mood normal.        Thought Content: Thought content normal.          The ASCVD Risk score (Arnett DK, et al., 2019) failed to calculate for the following reasons:   Risk score cannot be calculated because patient has a medical history suggesting prior/existing ASCVD    Assessment & Plan:   Problem List Items Addressed This Visit       Cardiovascular and Mediastinum   Essential hypertension - Primary   Her current antihypertensive regimen consists of amlodipine  5 mg daily, metoprolol  tartrate 25 mg twice daily, and HCTZ 12.5 mg daily.  BP is mildly elevated today.  Her antihypertensive medications have previously been reduced due to hypotension.  She  checks her blood pressure regularly at home and reports systolic readings consistently ranging 120-130 mmHg.  No medication changes were made today.  Will continue to closely monitor BP at subsequent appointments.      CAD S/P PCI   She remains on ASA 81 mg daily, metoprolol  tartrate 25 mg twice daily, and Zetia .      Relevant Orders   CBC with Differential/Platelet (Completed)     Other   Dyslipidemia, goal LDL below 70   She has a previously documented history of intolerance to the statins and Repatha . Recheck fasting lipid panel today.       Relevant Orders   CMP14+EGFR (Completed)   Lipid Profile (Completed)   TSH + free T4 (Completed)   Vitamin D  insufficiency   She is taking daily vitamin D  + calcium supplementation.  Will check vitamin D  level again today.      Relevant Orders   Vitamin D  (25 hydroxy) (Completed)   Prediabetes   A1c 5.7 on labs from October.  We discussed the need to make significant dietary changes in an effort to improve both her blood sugar and cholesterol levels.      Relevant Orders   CMP14+EGFR (Completed)   HgB A1c (Completed)   Other Visit Diagnoses       Encounter for immunization       Relevant Orders   Flu vaccine HIGH DOSE PF(Fluzone Trivalent) (Completed)       Return in about 6 months (around 01/30/2025) for chronic follow-up with PCP.    Leita Longs, FNP

## 2024-08-02 LAB — CBC WITH DIFFERENTIAL/PLATELET
Basophils Absolute: 0.1 x10E3/uL (ref 0.0–0.2)
Basos: 1 %
EOS (ABSOLUTE): 0.1 x10E3/uL (ref 0.0–0.4)
Eos: 2 %
Hematocrit: 37.3 % (ref 34.0–46.6)
Hemoglobin: 12 g/dL (ref 11.1–15.9)
Immature Grans (Abs): 0 x10E3/uL (ref 0.0–0.1)
Immature Granulocytes: 0 %
Lymphocytes Absolute: 1.4 x10E3/uL (ref 0.7–3.1)
Lymphs: 28 %
MCH: 30.5 pg (ref 26.6–33.0)
MCHC: 32.2 g/dL (ref 31.5–35.7)
MCV: 95 fL (ref 79–97)
Monocytes Absolute: 0.5 x10E3/uL (ref 0.1–0.9)
Monocytes: 10 %
Neutrophils Absolute: 2.9 x10E3/uL (ref 1.4–7.0)
Neutrophils: 59 %
Platelets: 396 x10E3/uL (ref 150–450)
RBC: 3.94 x10E6/uL (ref 3.77–5.28)
RDW: 11.9 % (ref 11.7–15.4)
WBC: 4.9 x10E3/uL (ref 3.4–10.8)

## 2024-08-02 LAB — CMP14+EGFR
ALT: 15 IU/L (ref 0–32)
AST: 17 IU/L (ref 0–40)
Albumin: 4.1 g/dL (ref 3.8–4.8)
Alkaline Phosphatase: 87 IU/L (ref 49–135)
BUN/Creatinine Ratio: 15 (ref 12–28)
BUN: 14 mg/dL (ref 8–27)
Bilirubin Total: 0.4 mg/dL (ref 0.0–1.2)
CO2: 23 mmol/L (ref 20–29)
Calcium: 10 mg/dL (ref 8.7–10.3)
Chloride: 103 mmol/L (ref 96–106)
Creatinine, Ser: 0.92 mg/dL (ref 0.57–1.00)
Globulin, Total: 3.2 g/dL (ref 1.5–4.5)
Glucose: 92 mg/dL (ref 70–99)
Potassium: 4 mmol/L (ref 3.5–5.2)
Sodium: 141 mmol/L (ref 134–144)
Total Protein: 7.3 g/dL (ref 6.0–8.5)
eGFR: 65 mL/min/1.73 (ref >59)

## 2024-08-02 LAB — LIPID PANEL
Chol/HDL Ratio: 5.9 ratio — ABNORMAL HIGH (ref 0.0–4.4)
Cholesterol, Total: 249 mg/dL — ABNORMAL HIGH (ref 100–199)
HDL: 42 mg/dL (ref >39)
LDL Chol Calc (NIH): 186 mg/dL — ABNORMAL HIGH (ref 0–99)
Triglycerides: 118 mg/dL (ref 0–149)
VLDL Cholesterol Cal: 21 mg/dL (ref 5–40)

## 2024-08-02 LAB — TSH+FREE T4
Free T4: 0.94 ng/dL (ref 0.82–1.77)
TSH: 1.4 u[IU]/mL (ref 0.450–4.500)

## 2024-08-02 LAB — VITAMIN D 25 HYDROXY (VIT D DEFICIENCY, FRACTURES): Vit D, 25-Hydroxy: 43.3 ng/mL (ref 30.0–100.0)

## 2024-08-02 LAB — HEMOGLOBIN A1C
Est. average glucose Bld gHb Est-mCnc: 111 mg/dL
Hgb A1c MFr Bld: 5.5 % (ref 4.8–5.6)

## 2024-08-08 NOTE — Assessment & Plan Note (Signed)
 Her current antihypertensive regimen consists of amlodipine  5 mg daily, metoprolol  tartrate 25 mg twice daily, and HCTZ 12.5 mg daily.  BP is mildly elevated today.  Her antihypertensive medications have previously been reduced due to hypotension.  She checks her blood pressure regularly at home and reports systolic readings consistently ranging 120-130 mmHg.  No medication changes were made today.  Will continue to closely monitor BP at subsequent appointments.

## 2024-08-08 NOTE — Assessment & Plan Note (Signed)
 She remains on ASA 81 mg daily, metoprolol  tartrate 25 mg twice daily, and Zetia .

## 2024-08-08 NOTE — Assessment & Plan Note (Signed)
 She has a previously documented history of intolerance to the statins and Repatha . Recheck fasting lipid panel today.

## 2024-08-08 NOTE — Assessment & Plan Note (Signed)
 She is taking daily vitamin D  + calcium supplementation.  Will check vitamin D  level again today.

## 2024-08-08 NOTE — Assessment & Plan Note (Signed)
 A1c 5.7 on labs from October.  We discussed the need to make significant dietary changes in an effort to improve both her blood sugar and cholesterol levels.

## 2024-08-14 ENCOUNTER — Encounter: Payer: Self-pay | Admitting: Cardiology

## 2024-08-14 ENCOUNTER — Ambulatory Visit: Attending: Cardiology | Admitting: Cardiology

## 2024-08-14 VITALS — BP 134/74 | HR 66 | Ht 68.0 in | Wt 168.0 lb

## 2024-08-14 DIAGNOSIS — E782 Mixed hyperlipidemia: Secondary | ICD-10-CM

## 2024-08-14 DIAGNOSIS — I10 Essential (primary) hypertension: Secondary | ICD-10-CM | POA: Diagnosis not present

## 2024-08-14 DIAGNOSIS — I2510 Atherosclerotic heart disease of native coronary artery without angina pectoris: Secondary | ICD-10-CM

## 2024-08-14 NOTE — Progress Notes (Signed)
 Clinical Summary Ms. Bible is a 78 y.o.female seen today for follow up of the following medical problems.    1. CAD - admit NSTEMI 05/2016, received DES to ramus. Echo at that time LVEF 55-60%, basalinferior hypokinesis.  - beta blocker limited due to bradycardia - lisinopirl stopped due to tongue swelling during ER visit 11/2017.       -12/2023 nuclear tress: no ischemia  - no recent chest pains, no SOB/DOE - compliant with meds  2. Hyperlipidemia - statin intolerant. Started on zetia  during 05/2016 admission.  - part of [employer] research study - reluctant for pcski9i, but more open to it now.      - referred to lipid clinic, was to start repatha    -on repatha  cold like symptoms, followed by severe muscle aches - symptoms starting to resolve. She reports long lingering symptoms  - not intersted in trying leqvio.   07/2024 TC 249 TG 118 HDL 41 LDL 186 - remains not interested in leqvio       3. HTN - she reports pcp lowered meds prevoiusly due to low bp's.   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  Past Medical History:  Diagnosis Date   Atrial fibrillation (HCC)    a. occurred in 2000, never placed on anti-coagulation. No known recurrence.    CAD (coronary artery disease)    a. cath 05/2016: 99% stenosis RI (DES placed), mild non-obstructive disease RCA, 70% along small caliber diagonal Janelli Welling   HLD (hyperlipidemia)    a. intoelrant to statin therapy   Hypertension    Trigeminal neuralgia      Allergies  Allergen Reactions   Bee Venom Anaphylaxis and Swelling   Halls Cough Drops [Menthol] Shortness Of Breath   Lisinopril  Swelling   Atorvastatin    Codeine Nausea And Vomiting   Erythromycin    Milk-Related Compounds Other (See Comments)    Rash in inside of mouth , digestive issues   Morphine And Codeine Nausea And Vomiting   Niacin And Related Swelling   Other     PT  CAN NOT BE AROUND HALLS COUGH DROPS -  IT TAKES HER BREATH AWAY    Statins      Current Outpatient Medications  Medication Sig Dispense Refill   amLODipine  (NORVASC ) 5 MG tablet Take 1 tablet by mouth once daily 90 tablet 0   aspirin  EC 81 MG EC tablet Take 1 tablet (81 mg total) by mouth daily.     carbamazepine (CARBATROL) 200 MG 12 hr capsule Take 200 mg by mouth 2 (two) times daily as needed (FACIAL PAIN).     ezetimibe  (ZETIA ) 10 MG tablet Take 1 tablet (10 mg total) by mouth daily. 90 tablet 2   hydrochlorothiazide  (HYDRODIURIL ) 12.5 MG tablet Take 1 tablet (12.5 mg total) by mouth daily. 90 tablet 1   ibuprofen (ADVIL) 200 MG tablet Take 200 mg by mouth every 6 (six) hours as needed. 600 mg     loratadine (CLARITIN) 10 MG tablet Take 10 mg by mouth daily as needed for allergies.     magnesium oxide (MAG-OX) 400 (240 Mg) MG tablet Take 400 mg by mouth daily.     metoprolol  tartrate (LOPRESSOR ) 25 MG tablet TAKE 1 TABLET BY MOUTH EVERY MORNING AND 1/2 TABLET IN THE EVENING 135 tablet 2   nitroGLYCERIN  (NITROSTAT ) 0.4 MG SL tablet  Place 1 tablet (0.4 mg total) under the tongue every 5 (five) minutes x 3 doses as needed for chest pain (up to 3 doses, If no relief after 3rd dose, proceed to ED or call 911). 25 tablet 3   omeprazole  (PRILOSEC) 20 MG capsule TAKE 1 CAPSULE BY MOUTH EVERY DAY 30 MINUTES BEFORE BREAKFAST 90 capsule 3   potassium chloride  SA (KLOR-CON  M) 20 MEQ tablet Take 1 tablet (20 mEq total) by mouth daily. 90 tablet 2   No current facility-administered medications for this visit.     Past Surgical History:  Procedure Laterality Date   ABDOMINAL HYSTERECTOMY     CARDIAC CATHETERIZATION N/A 05/14/2016   Procedure: Left Heart Cath and Coronary Angiography;  Surgeon: Lonni JONETTA Cash, MD;  Location: [facility] INVASIVE CV LAB;  Service: Cardiovascular;  Laterality: N/A;   CARDIAC CATHETERIZATION N/A 05/14/2016   Procedure: Coronary Stent Intervention;  Surgeon: Lonni JONETTA Cash, MD;  Location: [facility] INVASIVE CV LAB;  Service: Cardiovascular;  Laterality: N/A;   CHOLECYSTECTOMY     AGE 55   COLONOSCOPY N/A 12/25/2018   Procedure: COLONOSCOPY;  Surgeon: Harvey Margo CROME, MD;  Location: [facility] ENDO SUITE;  Service: Endoscopy;  Laterality: N/A;  2:15pm   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    POLYPECTOMY  12/25/2018   Procedure: POLYPECTOMY;  Surgeon: Harvey Margo CROME, MD;  Location: [facility] SU[facility];  Service: Endoscopy;;     Allergies  Allergen Reactions   Bee Venom Anaphylaxis and Swelling   Halls Cough Drops [Menthol] Shortness Of Breath   Lisinopril  Swelling   Atorvastatin    Codeine Nausea And Vomiting   Erythromycin    Milk-Related Compounds Other (See Comments)    Rash in inside of mouth , digestive issues   Morphine And Codeine Nausea And Vomiting   Niacin And Related Swelling   Other     PT CAN NOT BE AROUND HALLS COUGH DROPS -  IT TAKES HER BREATH AWAY    Statins       Family History  Problem Relation Age of Onset   Heart Problems Mother    Stroke Father    Diabetes Father    Heart attack Maternal Grandfather    Heart attack Brother    Heart attack Brother    Colon cancer Neg Hx    Colon polyps Neg Hx    Breast cancer Neg Hx      Social History Ms. Sawatzky reports that she has never smoked. She has never used smokeless tobacco. Ms. Brownfield reports no history of alcohol use.    Physical Examination Vitals:   08/14/24 1435  BP: 134/74  Pulse: 66  SpO2: 96%   Filed Weights   08/14/24 1435  Weight: 168 lb (76.2 kg)    Gen: resting comfortably, no acute distress HEENT: no scleral icterus, pupils equal round and reactive, no palptable cervical adenopathy,  CV: RRR, no m/rg, no jvd Resp: Clear to auscultation bilaterally GI: abdomen is soft, non-tender, non-distended, normal bowel sounds, no hepatosplenomegaly MSK: extremities are warm, no edema.  Skin: warm, no rash Neuro:  no focal deficits Psych:  appropriate affect   Diagnostic Studies  05/2016 cath   Prox RCA to Mid RCA lesion, 40 %stenosed. Ost Ramus lesion, 30 %stenosed. A STENT PROMUS PREM MR 2.25X16 drug eluting stent was successfully placed. Ramus lesion, 99 %stenosed. Post intervention, there is a 0% residual stenosis. 1st Diag lesion, 70 %stenosed. The left ventricular systolic  function is normal. LV end diastolic pressure is normal. The left ventricular ejection fraction is 55-65% by visual estimate. There is no mitral valve regurgitation.   1. Severe stenosis mid Ramus intermediate Digby Groeneveld, culprit for NSTEMI 2. Mild non-obstructive disease in the RCA.  3. Moderate stenosis small caliber Diagonal Ayza Ripoll 4. Normal LV systolic function 5. Successful PTCA/DES x 1 mid Ramus intermediate Kalissa Grays   Recommendations: Continue DAPT with ASA and Brilinta  for one year. She is statin intolerant. Consider PCSK9-inh after discharge. Start beta blocker. Follow up in [facility] office.    12/2023 nuclear stress The study is normal. There are no perfusion defects The study is low risk.   Less than 1 mm ST depression along inferior leads while walking on the treadmill. TWI actually more prominent with Lexiscan  with 1mm horizontal ST depression II/II/aVF/V5/V6 after lexiscan    LV perfusion is normal.   Left ventricular function is normal. End diastolic cavity size is normal.    Assessment and Plan   1. CAD -no recent symptoms - recent nuclear stress test without ischemia - continue current meds   2. Hyperlipidemia - intolerant to statins, has been on zetia  with very high LDL. - intolerant to repatha  - not interested in leqvio, if changes mind would refer to pharmD clinic.    3. HTN - at goal, continue current meds   Dorn PHEBE Ross, M.D.

## 2024-08-14 NOTE — Patient Instructions (Signed)
 Medication Instructions:  Continue all current medications.   Labwork: none  Testing/Procedures: none  Follow-Up: 6 months   Any Other Special Instructions Will Be Listed Below (If Applicable).   If you need a refill on your cardiac medications before your next appointment, please call your pharmacy.

## 2024-09-06 ENCOUNTER — Other Ambulatory Visit: Payer: Self-pay | Admitting: Cardiology

## 2025-01-30 ENCOUNTER — Ambulatory Visit
